# Patient Record
Sex: Female | Born: 1945
Health system: Southern US, Community
[De-identification: ages and names within clinical notes are randomized; demographics above are authoritative.]

## PROBLEM LIST (undated history)

## (undated) DIAGNOSIS — Z889 Allergy status to unspecified drugs, medicaments and biological substances status: Secondary | ICD-10-CM

## (undated) DIAGNOSIS — I10 Essential (primary) hypertension: Secondary | ICD-10-CM

## (undated) HISTORY — PX: GALLBLADDER SURGERY: SHX652

## (undated) HISTORY — PX: OTHER SURGICAL HISTORY: SHX169

## (undated) HISTORY — PX: PARTIAL HIP ARTHROPLASTY: SHX733

## (undated) HISTORY — PX: TOTAL ABDOMINAL HYSTERECTOMY: SHX209

## (undated) HISTORY — PX: APPENDECTOMY: SHX54

---

## 1998-04-17 ENCOUNTER — Other Ambulatory Visit: Admission: RE | Admit: 1998-04-17 | Discharge: 1998-04-17 | Payer: Self-pay | Admitting: *Deleted

## 2001-06-19 ENCOUNTER — Ambulatory Visit (HOSPITAL_COMMUNITY): Admission: RE | Admit: 2001-06-19 | Discharge: 2001-06-19 | Payer: Self-pay | Admitting: Family Medicine

## 2001-06-19 ENCOUNTER — Encounter: Payer: Self-pay | Admitting: Family Medicine

## 2004-01-23 ENCOUNTER — Inpatient Hospital Stay (HOSPITAL_COMMUNITY): Admission: EM | Admit: 2004-01-23 | Discharge: 2004-01-27 | Payer: Self-pay | Admitting: Emergency Medicine

## 2010-07-27 ENCOUNTER — Inpatient Hospital Stay (HOSPITAL_COMMUNITY)
Admission: RE | Admit: 2010-07-27 | Discharge: 2010-07-30 | Payer: Self-pay | Source: Home / Self Care | Admitting: Orthopedic Surgery

## 2010-08-31 ENCOUNTER — Inpatient Hospital Stay (HOSPITAL_COMMUNITY): Admission: RE | Admit: 2010-08-31 | Discharge: 2010-09-02 | Payer: Self-pay | Admitting: Orthopedic Surgery

## 2011-01-11 LAB — CBC
Hemoglobin: 7.7 g/dL — ABNORMAL LOW (ref 12.0–15.0)
MCHC: 34 g/dL (ref 30.0–36.0)
Platelets: 204 10*3/uL (ref 150–400)
Platelets: 231 10*3/uL (ref 150–400)
RBC: 3.08 MIL/uL — ABNORMAL LOW (ref 3.87–5.11)
RDW: 12.8 % (ref 11.5–15.5)
RDW: 13.3 % (ref 11.5–15.5)
WBC: 12.4 10*3/uL — ABNORMAL HIGH (ref 4.0–10.5)

## 2011-01-11 LAB — BASIC METABOLIC PANEL
Calcium: 8.3 mg/dL — ABNORMAL LOW (ref 8.4–10.5)
Calcium: 8.3 mg/dL — ABNORMAL LOW (ref 8.4–10.5)
Chloride: 104 mEq/L (ref 96–112)
Creatinine, Ser: 0.82 mg/dL (ref 0.4–1.2)
Creatinine, Ser: 0.83 mg/dL (ref 0.4–1.2)
GFR calc Af Amer: >60 mL/min (ref >60)
GFR calc non Af Amer: >60 mL/min (ref >60)
GFR calc non Af Amer: >60 mL/min (ref >60)
Glucose, Bld: 133 mg/dL — ABNORMAL HIGH (ref 70–99)
Sodium: 138 mEq/L (ref 135–145)

## 2011-01-11 LAB — TYPE AND SCREEN
Antibody Screen: NEGATIVE
Unit division: 0

## 2011-01-11 LAB — PREPARE RBC (CROSSMATCH)

## 2011-01-12 LAB — BASIC METABOLIC PANEL
Calcium: 9.6 mg/dL (ref 8.4–10.5)
Creatinine, Ser: 1.02 mg/dL (ref 0.4–1.2)
GFR calc Af Amer: >60 mL/min (ref >60)
GFR calc non Af Amer: 55 mL/min — ABNORMAL LOW (ref >60)
Sodium: 138 mEq/L (ref 135–145)

## 2011-01-12 LAB — URINALYSIS, ROUTINE W REFLEX MICROSCOPIC
Bilirubin Urine: NEGATIVE
Hgb urine dipstick: NEGATIVE
Nitrite: NEGATIVE
Protein, ur: NEGATIVE mg/dL
Specific Gravity, Urine: 1.018 (ref 1.005–1.030)
Urobilinogen, UA: 0.2 mg/dL (ref 0.0–1.0)

## 2011-01-12 LAB — URINE MICROSCOPIC-ADD ON

## 2011-01-12 LAB — DIFFERENTIAL
Basophils Relative: 1 % (ref 0–1)
Eosinophils Absolute: 0.2 10*3/uL (ref 0.0–0.7)
Lymphs Abs: 2.3 10*3/uL (ref 0.7–4.0)
Monocytes Relative: 6 % (ref 3–12)
Neutro Abs: 5.8 10*3/uL (ref 1.7–7.7)
Neutrophils Relative %: 65 % (ref 43–77)

## 2011-01-12 LAB — CBC
Hemoglobin: 12.4 g/dL (ref 12.0–15.0)
MCHC: 33.9 g/dL (ref 30.0–36.0)
Platelets: 334 10*3/uL (ref 150–400)
RBC: 3.87 MIL/uL (ref 3.87–5.11)

## 2011-01-12 LAB — SURGICAL PCR SCREEN: Staphylococcus aureus: NEGATIVE

## 2011-01-12 LAB — PROTIME-INR
INR: 0.96 (ref 0.00–1.49)
Prothrombin Time: 13 seconds (ref 11.6–15.2)

## 2011-01-12 LAB — APTT: aPTT: 34 seconds (ref 24–37)

## 2011-01-13 LAB — CBC
HCT: 20.4 % — ABNORMAL LOW (ref 36.0–46.0)
HCT: 23.6 % — ABNORMAL LOW (ref 36.0–46.0)
HCT: 35 % — ABNORMAL LOW (ref 36.0–46.0)
Hemoglobin: 10.1 g/dL — ABNORMAL LOW (ref 12.0–15.0)
Hemoglobin: 12 g/dL (ref 12.0–15.0)
MCH: 32.4 pg (ref 26.0–34.0)
MCH: 32.6 pg (ref 26.0–34.0)
MCHC: 34.3 g/dL (ref 30.0–36.0)
MCV: 93.2 fL (ref 78.0–100.0)
MCV: 94.7 fL (ref 78.0–100.0)
MCV: 95.1 fL (ref 78.0–100.0)
Platelets: 252 10*3/uL (ref 150–400)
RBC: 2.15 MIL/uL — ABNORMAL LOW (ref 3.87–5.11)
RBC: 2.47 MIL/uL — ABNORMAL LOW (ref 3.87–5.11)
RBC: 3.1 MIL/uL — ABNORMAL LOW (ref 3.87–5.11)
RDW: 12.3 % (ref 11.5–15.5)
RDW: 12.6 % (ref 11.5–15.5)
WBC: 10.8 10*3/uL — ABNORMAL HIGH (ref 4.0–10.5)
WBC: 8.6 10*3/uL (ref 4.0–10.5)

## 2011-01-13 LAB — ABO/RH: ABO/RH(D): A POS

## 2011-01-13 LAB — URINALYSIS, ROUTINE W REFLEX MICROSCOPIC
Bilirubin Urine: NEGATIVE
Glucose, UA: NEGATIVE mg/dL
Nitrite: POSITIVE — AB
Specific Gravity, Urine: 1.016 (ref 1.005–1.030)
pH: 6 (ref 5.0–8.0)

## 2011-01-13 LAB — BASIC METABOLIC PANEL
BUN: 24 mg/dL — ABNORMAL HIGH (ref 6–23)
CO2: 24 mEq/L (ref 19–32)
Chloride: 106 mEq/L (ref 96–112)
Chloride: 108 mEq/L (ref 96–112)
GFR calc Af Amer: >60 mL/min (ref >60)
GFR calc non Af Amer: 58 mL/min — ABNORMAL LOW (ref >60)
Glucose, Bld: 100 mg/dL — ABNORMAL HIGH (ref 70–99)
Glucose, Bld: 96 mg/dL (ref 70–99)
Potassium: 3.7 mEq/L (ref 3.5–5.1)
Potassium: 4.1 mEq/L (ref 3.5–5.1)
Potassium: 4.4 mEq/L (ref 3.5–5.1)
Sodium: 137 mEq/L (ref 135–145)
Sodium: 139 mEq/L (ref 135–145)

## 2011-01-13 LAB — DIFFERENTIAL
Basophils Absolute: 0 10*3/uL (ref 0.0–0.1)
Basophils Relative: 0 % (ref 0–1)
Eosinophils Absolute: 0.1 10*3/uL (ref 0.0–0.7)
Eosinophils Relative: 1 % (ref 0–5)
Monocytes Absolute: 0.5 10*3/uL (ref 0.1–1.0)
Monocytes Relative: 7 % (ref 3–12)
Neutro Abs: 5.8 10*3/uL (ref 1.7–7.7)

## 2011-01-13 LAB — TYPE AND SCREEN: ABO/RH(D): A POS

## 2011-01-13 LAB — URINE MICROSCOPIC-ADD ON

## 2011-01-13 LAB — SURGICAL PCR SCREEN: Staphylococcus aureus: NEGATIVE

## 2011-01-13 LAB — APTT: aPTT: 30 seconds (ref 24–37)

## 2011-03-18 NOTE — Procedures (Signed)
NAME:  Tanya Hammond, Tanya Hammond                           ACCOUNT NO.:  [id number]   MEDICAL RECORD NO.:  [id number]                   PATIENT TYPE:  INP   LOCATION:  A328                                 FACILITY:  [facility]   PHYSICIAN:  Dani Gobble, MD                    DATE OF BIRTH:  10/13/1946   DATE OF PROCEDURE:  DATE OF DISCHARGE:                                  ECHOCARDIOGRAM   INDICATION:  [name] is a 65 year old female without prior cardiac history  who has had a stroke.   TECHNICAL QUALITY:  Severely limited secondary to patient body habitus and  poor acoustic windows.   FINDINGS:  1. The aorta appears subjectively to be within normal limits in size.  2. The same could be said for the left atrium.  3. The patient appeared to be in sinus rhythm with a rate in the 90's     throughout the procedure.  The study is not adequate to assess for subtle     findings such as mass or clot.  4. The intraventricular septum and posterior wall are mildly thickened.  5. The aortic valve is not well visualized but appears to have reasonable     leaflet excursion.  No obvious aortic insufficiency is appreciated.     Doppler studies reveal a peak velocity of 1.8 meters per second     corresponding to a peak gradient of 13 mmHg and a mean gradient of 9     mmHg.  6. The mitral valve also is not well visualized, but appeared to be grossly     structurally normal.  Mild mitral annular calcification was noted.  Mild     mitral regurgitation was noted.  No mitral valve prolapse was noted.     Doppler interrogation of the mitral valve is within normal limits.  7. The pulmonic valve was not visualized.  8. The right-sided structures were also poorly visualized.  9. The left ventricle appeared grossly normal in size with vigorous     hypodynamic left ventricular systolic function.  There was virtual     systolic cavity obliteration.  The presence of diastolic dysfunction is     inferred from pulse wave  Doppler across the mitral valve.   IMPRESSION:  1. Technically difficult study secondary to patient body habitus and poor     acoustic windows.  2. Mild concentric left ventricular hypertrophy.  3. Mild aortic sclerosis without stenosis by Doppler only, as the valve was     not well visualized.  4. Mild mitral annular calcification.  5. Mild mitral regurgitation.  6. Vigorous to hyperdynamic left ventricular systolic function with virtual     systolic cavity obliteration; unable to     assess for regional wall motion abnormalities on this study.  7. Right sided structures were not well visualized.  8. Consider transesophageal echocardiogram if clinically indicated.  ___________________________________________                                            Dani Gobble, MD   AB/MEDQ  D:  01/26/2004  T:  01/26/2004  Job:  [id number]   cc:   Kirk Ruths, M.D.  [address]  [place]  [place] [address]  Fax: [phone]

## 2011-03-18 NOTE — Consult Note (Signed)
NAME:  Tanya Hammond, Tanya Hammond                           ACCOUNT NO.:  [id number]   MEDICAL RECORD NO.:  [id number]                   PATIENT TYPE:  INP   LOCATION:  A223                                 FACILITY:  [facility]   PHYSICIAN:  Kofi A. Gerilyn Pilgrim, M.D.              DATE OF BIRTH:  04-14-1946   DATE OF CONSULTATION:  01/23/2004  DATE OF DISCHARGE:                                   CONSULTATION   REASON FOR CONSULTATION:  Acute stroke.   IMPRESSION:  Acute stroke, lacunar type, with pure motor hemiparesis likely  located in internal capsule on the right but also could be in the pontine  region or corona radiata.   RECOMMENDATIONS:  She already has carotids and Dopplers ordered, agree with  this. I also agree with fasting lipid profile.  I went to add homocystine  level. I would recommend that she be treated with lipid lowering agents with  ____________ regardless of her cholesterol levels as this can further reduce  her risk.  I want to discontinue her heparin and place her on Plavix  antiplatelet agents.  I will probably start her on low dose Lovenox for DVT  prophylaxis.   HISTORY:  This is a 65 year old, right handed, Caucasian female who  developed acute onset of difficulty using her left side while she was  driving on yesterday. She attempted to get out of the car and noticed that  she had difficulty using her left upper extremity and left leg. She in fact  fell three times. The patient denied any numbness but the chart revealed  that she may have had some numbness in the right face.  The patient had a  longstanding history of hypertension for about 30 years.  The patient was  not taking any form of antiplatelet agents before coming to the hospital.   PAST MEDICAL HISTORY:  She had a history of cholecystectomy, hysterectomy,  hypertension.   ADMISSION MEDICATIONS:  Atacand, Lorcet and Aleve.   REVIEW OF SYMPTOMS:  Apparently has had problems with chronic low back pain  with some  right leg weakness although she reports the weakness may have  gotten worse since she has been hospitalized. She also is complaining of a  lot of cramps in the right leg and she currently takes Lorcet and Aleve for  chronic back pain and leg problems. She probably has had bronchitis in the  past.  No headaches are reported.   ALLERGIES:  ASPIRIN causes significant GI upset.   PHYSICAL EXAMINATION:  GENERAL:  Well built lady in no acute distress,  average weight.  VITAL SIGNS:  Blood pressure is 121/62, pulse 119, respirations 20,  temperature 98.0.  LUNGS:  Clear to auscultation bilaterally.  CARDIOVASCULAR:  Normal S1, S2.  ABDOMEN:  Soft.  EXTREMITIES:  Show some varicosities, no edema is noted.  NEUROLOGIC:  She is awake.  She is alert, she converses fluently,  coherently.  No dysarthria is noted.  Naming and language is unremarkable.  Comprehension of fluids are normal.  Cranial nerves II-XII are intact  including the visual fields.  No facial asymmetry is noted. Motor  examination shows weakness of the left side with triceps and deltoids at 4.  On the left hand grip is normal.  She has about 4/5 weakness in the left leg  proximally, distally 5/5.  She also had some weakness of the right leg  proximally 4/5, distally 5/5. The right upper extremity is normal.  Reflexes  are brisk on the left side, toes upgoing on the left and downgoing on the  right.  Sensory examination showed normal to temperature, light touch and  double simultaneous stimulation.  Coordination is unremarkable.  Head CT  scan shows atrophy and chronic ischemia changes involving the white matter.   WBC 11, CPK 302, MB fraction 4.7.      ___________________________________________                                            Darleen Crocker A. Gerilyn Pilgrim, M.D.   KAD/MEDQ  D:  01/23/2004  T:  01/24/2004  Job:  [id number]

## 2011-03-18 NOTE — Discharge Summary (Signed)
NAME:  Tanya Hammond, Tanya Hammond                           ACCOUNT NO.:  [id number]   MEDICAL RECORD NO.:  [id number]                   PATIENT TYPE:  INP   LOCATION:  A328                                 FACILITY:  [facility]   PHYSICIAN:  Kirk Ruths, M.D.            DATE OF BIRTH:  [birth year]   DATE OF ADMISSION:  01/23/2004  DATE OF DISCHARGE:  01/27/2004                                 DISCHARGE SUMMARY   DISCHARGE DIAGNOSES:  1. Acute cerebrovascular accident with left hemiparesis, resolved.  2. Hypertension.  3. History of aspirin intolerance.  4. Status post gallbladder surgery and hysterectomy.  5. Chronic back pain.   HOSPITAL COURSE:  This 65 year old white female noted as she was driving  home from work day before admission, she had an inability to keep it in the  correct lane.  When she arrived at her home and went to get out of the car,  she fell, bruising herself.  The patient developed some numbness in her  face, continued to have falling during the next 12 hours.  The patient  presented to the emergency room with her mother the following day and had  multiple bruises of arms and chest and face and legs.  The patient had a  left facial droop at the time of admission with left upper and lower  weakness.  The patient was admitted to the floor.  CT scan showed atrophy  and small vessel ischemic changes, no acute abnormality.  Chest x-ray was  negative.  Laboratories were all basically within normal range, hemoglobin  being 12.8.  Electrolytes were normal with a BUN and creatinine of 21 and  0.6.  The patient had a homocysteine level slightly elevated at 15.89, with  the upper limits of normal being 13.9.  Initial CPK was elevated at 302 with  an MB of 4.7; this is consistent with her bruising.  Fasting lipid profile  showed a cholesterol of 178, triglycerides of 136, HDL of 51, LDL of 100.  The patient was seen in consultation by Dr. Darleen Crocker A. [place] for neurology,  who basically  changed the patient from heparin to Lovenox and Plavix.  The  patient received physical therapy and within 48 hours, had full resolution  of all of her symptoms with no weakness, no facial droop, no abnormality in  her gait.  Carotid Dopplers were negative for any kind of flow obstruction.  Echocardiogram showed no evidence of abnormality related to clot.  She is  100% recovered.   DISCHARGE MEDICATIONS:  She is discharged home on her Atacand, which she was  on previously, as well as Plavix 75 mg daily.   FOLLOWUP:  The patient will be followed in the office in 2-4 weeks as  needed.   CONDITION ON DISCHARGE:  She is stable at the time of discharge.     ___________________________________________  Kirk Ruths, M.D.   WMM/MEDQ  D:  01/27/2004  T:  01/27/2004  Job:  [id number]

## 2011-03-18 NOTE — H&P (Signed)
NAME:  Tanya Hammond, Tanya Hammond                           ACCOUNT NO.:  [id number]   MEDICAL RECORD NO.:  [id number]                   PATIENT TYPE:  EMS   LOCATION:  ED                                   FACILITY:  [facility]   PHYSICIAN:  Kirk Ruths, M.D.            DATE OF BIRTH:  12/01/1945   DATE OF ADMISSION:  01/23/2004  DATE OF DISCHARGE:                                HISTORY & PHYSICAL   CHIEF COMPLAINT:  Numbness and can not walk.   HISTORY OF PRESENT ILLNESS:  This is a 65 year old white female with a  history of hypertension who stated she noticed yesterday driving home from  work, she had difficulty staying on her side of the road.  When she arrived  at home, she got out of her car, and she fell down and had difficulty  ambulating.  The patient also has noticed some numbness on the right side of  her face, a feeling of drawing.  The patient states she has continued to  fall during the night, and with difficulty arising.  She presents to the  emergency room with multiple bruises to her chest and arms.  She has an  obvious left facial droop, and a presumptive diagnosis of a CVA.   She will be admitted for IV heparin, physical therapy as well as evaluation  of her carotids and cardiac.   PAST MEDICAL HISTORY:  She is allergic to aspirin.   CURRENT MEDICATIONS:  1. She currently takes Atacand 16 mg daily for hypertension.  2. Lorcet Plus.  3. Aleve for chronic back pain.   PAST SURGICAL HISTORY:  She is status post cholecystectomy and hysterectomy.   REVIEW OF SYSTEMS:  She denies chest pain.  She denies headache.  Chronic  bronchitis over the last several weeks.   PHYSICAL EXAMINATION:  GENERAL:  A well-developed, well-nourished white  female, in no severe distress.  VITAL SIGNS:  Blood pressure 146/88, respirations 20 and unlabored.  She is  afebrile.  Pulse is 88 and regular.  HEENT:  TM's are normal.  Pupils are equal to light and accommodation.  Oropharynx is benign.  NECK:  Supple without JVD, bruits or thyromegaly.  LUNGS:  Some occasional rhonchi throughout.  HEART:  Regular sinus rhythm, no murmur, gallop or rub.  ABDOMEN:  Soft and nontender.  EXTREMITIES:  Without clubbing, cyanosis or edema.  NEUROLOGIC:  There is an obvious left facial droop.  Her strength is 2/5 on  the left upper extremity and mild decreased strength in the left lower  extremity.  Reflexes are bilaterally equal.   ASSESSMENT:  1. Cerebrovascular accident involving the right middle cerebral artery with     left-sided hemiparesis.  2. History of hypertension.  3. History of aspirin allergy.  4. History of status post gallbladder surgery and hysterectomy.     ___________________________________________  Kirk Ruths, M.D.   WMM/MEDQ  D:  01/23/2004  T:  01/23/2004  Job:  [id number]

## 2011-07-07 ENCOUNTER — Emergency Department (HOSPITAL_COMMUNITY)
Admission: EM | Admit: 2011-07-07 | Discharge: 2011-07-07 | Disposition: A | Discharge status: Home / Self Care | Payer: Medicare Other | Attending: Emergency Medicine | Admitting: Emergency Medicine

## 2011-07-07 ENCOUNTER — Encounter: Payer: Self-pay | Admitting: *Deleted

## 2011-07-07 DIAGNOSIS — F17200 Nicotine dependence, unspecified, uncomplicated: Secondary | ICD-10-CM | POA: Insufficient documentation

## 2011-07-07 DIAGNOSIS — H1130 Conjunctival hemorrhage, unspecified eye: Secondary | ICD-10-CM | POA: Insufficient documentation

## 2011-07-07 DIAGNOSIS — I10 Essential (primary) hypertension: Secondary | ICD-10-CM | POA: Insufficient documentation

## 2011-07-07 HISTORY — DX: Allergy status to unspecified drugs, medicaments and biological substances: Z88.9

## 2011-07-07 HISTORY — DX: Essential (primary) hypertension: I10

## 2011-07-07 NOTE — ED Notes (Signed)
Pt a/ox4. Resp even and unlabored. NAD at this time. D/C instructions reviewed with pt. Pt verbalized understanding. Pt ambulated to POV with steady gate. 

## 2011-07-07 NOTE — ED Provider Notes (Signed)
History     CSN: [id number] Arrival date & time: 07/07/2011  7:32 PM  Chief Complaint  Patient presents with  . Eye Problem   Patient is a 65 y.o. female presenting with eye problem. The history is provided by the patient.  Eye Problem  This is a new problem. The current episode started 3 to 5 hours ago. The problem occurs constantly. The problem has not changed since onset.There is pain in the left eye. There was no injury mechanism. The pain is at a severity of 0/10. The patient is experiencing no pain. Associated symptoms include eye redness. Pertinent negatives include no blurred vision, no discharge, no foreign body sensation, no photophobia and no itching. She has tried nothing for the symptoms.  She noticed her eye was red after coming in from her yard.  Past Medical History  Diagnosis Date  . Hypertension   . Multiple allergies     Past Surgical History  Procedure Date  . Ortho procedures     History reviewed. No pertinent family history.  History  Substance Use Topics  . Smoking status: Current Everyday Smoker  . Smokeless tobacco: Not on file  . Alcohol Use: Yes    OB History    Grav Para Term Preterm Abortions TAB SAB Ect Mult Living                  Review of Systems  Eyes: Positive for redness. Negative for blurred vision, photophobia and discharge.  Skin: Negative for itching.  All other systems reviewed and are negative.    Physical Exam  BP 121/71  Pulse 104  Temp(Src) 98.5 F (36.9 C) (Oral)  Resp 18  SpO2 98%  Physical Exam  Nursing note and vitals reviewed. Constitutional: She is oriented to person, place, and time. She appears well-developed and well-nourished. No distress.  HENT:  Head: Normocephalic and atraumatic.  Right Ear: External ear normal.  Left Ear: External ear normal.  Mouth/Throat: Oropharynx is clear and moist.  Eyes: EOM are normal. Pupils are equal, round, and reactive to light. Left conjunctiva has a hemorrhage. No  scleral icterus.  Fundoscopic exam:      The right eye shows no exudate, no hemorrhage and no papilledema.       The left eye shows no exudate, no hemorrhage and no papilledema.  Neck: Normal range of motion. Neck supple. No JVD present.  Cardiovascular: Normal rate, regular rhythm and normal heart sounds.   No murmur heard. Pulmonary/Chest: Effort normal and breath sounds normal. She has no wheezes. She has no rales. She exhibits no tenderness.  Abdominal: Bowel sounds are normal. She exhibits no mass. There is no tenderness.  Musculoskeletal: Normal range of motion. She exhibits no edema and no tenderness.  Lymphadenopathy:    She has no cervical adenopathy.  Neurological: She is alert and oriented to person, place, and time. She has normal reflexes. No cranial nerve deficit. Coordination normal.  Skin: Skin is warm and dry. No rash noted.  Psychiatric: She has a normal mood and affect. Her behavior is normal. Judgment and thought content normal.    ED Course  Procedures  MDM Subconjunctival hemorrhage, no evidence of other eye injury.      Dione Booze, MD 07/07/11 2018

## 2011-07-07 NOTE — ED Notes (Signed)
States she came in from working outside and noted redness to left sclera left eye

## 2012-03-01 DIAGNOSIS — I10 Essential (primary) hypertension: Secondary | ICD-10-CM | POA: Diagnosis not present

## 2012-03-01 DIAGNOSIS — F17200 Nicotine dependence, unspecified, uncomplicated: Secondary | ICD-10-CM | POA: Diagnosis not present

## 2012-04-03 DIAGNOSIS — Z1231 Encounter for screening mammogram for malignant neoplasm of breast: Secondary | ICD-10-CM | POA: Diagnosis not present

## 2012-06-07 DIAGNOSIS — R300 Dysuria: Secondary | ICD-10-CM | POA: Diagnosis not present

## 2012-06-07 DIAGNOSIS — Z0000 Encounter for general adult medical examination without abnormal findings: Secondary | ICD-10-CM | POA: Diagnosis not present

## 2012-06-07 DIAGNOSIS — F17200 Nicotine dependence, unspecified, uncomplicated: Secondary | ICD-10-CM | POA: Diagnosis not present

## 2012-06-07 DIAGNOSIS — I10 Essential (primary) hypertension: Secondary | ICD-10-CM | POA: Diagnosis not present

## 2012-12-06 DIAGNOSIS — F17200 Nicotine dependence, unspecified, uncomplicated: Secondary | ICD-10-CM | POA: Diagnosis not present

## 2012-12-06 DIAGNOSIS — E785 Hyperlipidemia, unspecified: Secondary | ICD-10-CM | POA: Diagnosis not present

## 2012-12-06 DIAGNOSIS — I10 Essential (primary) hypertension: Secondary | ICD-10-CM | POA: Diagnosis not present

## 2013-04-25 DIAGNOSIS — J019 Acute sinusitis, unspecified: Secondary | ICD-10-CM | POA: Diagnosis not present

## 2013-06-06 DIAGNOSIS — E785 Hyperlipidemia, unspecified: Secondary | ICD-10-CM | POA: Diagnosis not present

## 2013-06-06 DIAGNOSIS — I10 Essential (primary) hypertension: Secondary | ICD-10-CM | POA: Diagnosis not present

## 2013-06-06 DIAGNOSIS — F17200 Nicotine dependence, unspecified, uncomplicated: Secondary | ICD-10-CM | POA: Diagnosis not present

## 2013-07-03 DIAGNOSIS — Z96649 Presence of unspecified artificial hip joint: Secondary | ICD-10-CM | POA: Diagnosis not present

## 2013-12-09 DIAGNOSIS — I10 Essential (primary) hypertension: Secondary | ICD-10-CM | POA: Diagnosis not present

## 2013-12-09 DIAGNOSIS — R7301 Impaired fasting glucose: Secondary | ICD-10-CM | POA: Diagnosis not present

## 2013-12-12 DIAGNOSIS — I10 Essential (primary) hypertension: Secondary | ICD-10-CM | POA: Diagnosis not present

## 2013-12-12 DIAGNOSIS — E785 Hyperlipidemia, unspecified: Secondary | ICD-10-CM | POA: Diagnosis not present

## 2013-12-12 DIAGNOSIS — R7301 Impaired fasting glucose: Secondary | ICD-10-CM | POA: Diagnosis not present

## 2014-03-11 DIAGNOSIS — F3289 Other specified depressive episodes: Secondary | ICD-10-CM | POA: Diagnosis not present

## 2014-03-11 DIAGNOSIS — I10 Essential (primary) hypertension: Secondary | ICD-10-CM | POA: Diagnosis not present

## 2014-03-11 DIAGNOSIS — F329 Major depressive disorder, single episode, unspecified: Secondary | ICD-10-CM | POA: Diagnosis not present

## 2014-05-01 DIAGNOSIS — I10 Essential (primary) hypertension: Secondary | ICD-10-CM | POA: Diagnosis not present

## 2014-05-01 DIAGNOSIS — F3289 Other specified depressive episodes: Secondary | ICD-10-CM | POA: Diagnosis not present

## 2014-05-01 DIAGNOSIS — J019 Acute sinusitis, unspecified: Secondary | ICD-10-CM | POA: Diagnosis not present

## 2014-05-01 DIAGNOSIS — F329 Major depressive disorder, single episode, unspecified: Secondary | ICD-10-CM | POA: Diagnosis not present

## 2015-10-08 DIAGNOSIS — I10 Essential (primary) hypertension: Secondary | ICD-10-CM | POA: Diagnosis not present

## 2015-10-08 DIAGNOSIS — R7301 Impaired fasting glucose: Secondary | ICD-10-CM | POA: Diagnosis not present

## 2015-10-12 DIAGNOSIS — I10 Essential (primary) hypertension: Secondary | ICD-10-CM | POA: Diagnosis not present

## 2015-10-12 DIAGNOSIS — R7301 Impaired fasting glucose: Secondary | ICD-10-CM | POA: Diagnosis not present

## 2015-10-12 DIAGNOSIS — E870 Hyperosmolality and hypernatremia: Secondary | ICD-10-CM | POA: Diagnosis not present

## 2015-10-12 DIAGNOSIS — F339 Major depressive disorder, recurrent, unspecified: Secondary | ICD-10-CM | POA: Diagnosis not present

## 2016-02-18 DIAGNOSIS — K047 Periapical abscess without sinus: Secondary | ICD-10-CM | POA: Diagnosis not present

## 2016-04-12 DIAGNOSIS — R7301 Impaired fasting glucose: Secondary | ICD-10-CM | POA: Diagnosis not present

## 2016-04-12 DIAGNOSIS — E782 Mixed hyperlipidemia: Secondary | ICD-10-CM | POA: Diagnosis not present

## 2016-04-14 DIAGNOSIS — I10 Essential (primary) hypertension: Secondary | ICD-10-CM | POA: Diagnosis not present

## 2016-04-14 DIAGNOSIS — E870 Hyperosmolality and hypernatremia: Secondary | ICD-10-CM | POA: Diagnosis not present

## 2016-04-14 DIAGNOSIS — R7301 Impaired fasting glucose: Secondary | ICD-10-CM | POA: Diagnosis not present

## 2016-07-06 DIAGNOSIS — H5212 Myopia, left eye: Secondary | ICD-10-CM | POA: Diagnosis not present

## 2016-07-06 DIAGNOSIS — H5201 Hypermetropia, right eye: Secondary | ICD-10-CM | POA: Diagnosis not present

## 2016-07-06 DIAGNOSIS — H52223 Regular astigmatism, bilateral: Secondary | ICD-10-CM | POA: Diagnosis not present

## 2016-07-06 DIAGNOSIS — Q132 Other congenital malformations of iris: Secondary | ICD-10-CM | POA: Diagnosis not present

## 2016-10-04 ENCOUNTER — Other Ambulatory Visit (HOSPITAL_COMMUNITY): Payer: Self-pay | Admitting: Internal Medicine

## 2016-10-04 DIAGNOSIS — Z1231 Encounter for screening mammogram for malignant neoplasm of breast: Secondary | ICD-10-CM

## 2016-10-11 DIAGNOSIS — R7301 Impaired fasting glucose: Secondary | ICD-10-CM | POA: Diagnosis not present

## 2016-10-11 DIAGNOSIS — E782 Mixed hyperlipidemia: Secondary | ICD-10-CM | POA: Diagnosis not present

## 2016-10-11 DIAGNOSIS — Z1159 Encounter for screening for other viral diseases: Secondary | ICD-10-CM | POA: Diagnosis not present

## 2016-10-13 ENCOUNTER — Encounter (HOSPITAL_COMMUNITY): Payer: Self-pay | Admitting: Radiology

## 2016-10-13 ENCOUNTER — Ambulatory Visit (HOSPITAL_COMMUNITY)
Admission: RE | Admit: 2016-10-13 | Discharge: 2016-10-13 | Disposition: A | Discharge status: Home / Self Care | Payer: Medicare Other | Source: Ambulatory Visit | Attending: Internal Medicine | Admitting: Internal Medicine

## 2016-10-13 DIAGNOSIS — Z1231 Encounter for screening mammogram for malignant neoplasm of breast: Secondary | ICD-10-CM | POA: Diagnosis not present

## 2016-10-13 DIAGNOSIS — I10 Essential (primary) hypertension: Secondary | ICD-10-CM | POA: Diagnosis not present

## 2016-10-13 DIAGNOSIS — R011 Cardiac murmur, unspecified: Secondary | ICD-10-CM | POA: Diagnosis not present

## 2016-10-13 DIAGNOSIS — E870 Hyperosmolality and hypernatremia: Secondary | ICD-10-CM | POA: Diagnosis not present

## 2016-10-17 ENCOUNTER — Other Ambulatory Visit: Payer: Self-pay | Admitting: Internal Medicine

## 2016-10-17 ENCOUNTER — Ambulatory Visit (HOSPITAL_COMMUNITY): Payer: Medicare Other

## 2016-10-17 DIAGNOSIS — R928 Other abnormal and inconclusive findings on diagnostic imaging of breast: Secondary | ICD-10-CM

## 2016-10-25 ENCOUNTER — Ambulatory Visit (HOSPITAL_COMMUNITY)
Admission: RE | Admit: 2016-10-25 | Discharge: 2016-10-25 | Disposition: A | Discharge status: Home / Self Care | Payer: Medicare Other | Source: Ambulatory Visit | Attending: Internal Medicine | Admitting: Internal Medicine

## 2016-10-25 DIAGNOSIS — R928 Other abnormal and inconclusive findings on diagnostic imaging of breast: Secondary | ICD-10-CM | POA: Diagnosis not present

## 2016-10-25 DIAGNOSIS — N6320 Unspecified lump in the left breast, unspecified quadrant: Secondary | ICD-10-CM | POA: Diagnosis not present

## 2016-11-10 DIAGNOSIS — I10 Essential (primary) hypertension: Secondary | ICD-10-CM | POA: Diagnosis not present

## 2016-12-05 ENCOUNTER — Ambulatory Visit: Payer: Medicare Other | Admitting: Cardiovascular Disease

## 2016-12-05 DIAGNOSIS — S39012A Strain of muscle, fascia and tendon of lower back, initial encounter: Secondary | ICD-10-CM | POA: Diagnosis not present

## 2016-12-05 DIAGNOSIS — M545 Low back pain: Secondary | ICD-10-CM | POA: Diagnosis not present

## 2016-12-27 ENCOUNTER — Ambulatory Visit: Payer: Medicare Other | Admitting: Cardiology

## 2017-01-20 ENCOUNTER — Other Ambulatory Visit: Payer: Self-pay | Admitting: Cardiovascular Disease

## 2017-01-20 ENCOUNTER — Ambulatory Visit (INDEPENDENT_AMBULATORY_CARE_PROVIDER_SITE_OTHER): Payer: Medicare Other | Admitting: Cardiovascular Disease

## 2017-01-20 ENCOUNTER — Encounter: Payer: Self-pay | Admitting: Cardiovascular Disease

## 2017-01-20 VITALS — BP 116/70 | HR 93 | Ht 68.0 in | Wt 184.0 lb

## 2017-01-20 DIAGNOSIS — I10 Essential (primary) hypertension: Secondary | ICD-10-CM

## 2017-01-20 DIAGNOSIS — R0789 Other chest pain: Secondary | ICD-10-CM

## 2017-01-20 DIAGNOSIS — I209 Angina pectoris, unspecified: Secondary | ICD-10-CM

## 2017-01-20 DIAGNOSIS — R011 Cardiac murmur, unspecified: Secondary | ICD-10-CM | POA: Diagnosis not present

## 2017-01-20 MED ORDER — NITROGLYCERIN 0.4 MG SL SUBL: 0.4000 mg | SUBLINGUAL_TABLET | SUBLINGUAL | 3 refills | Status: DC | PRN

## 2017-01-20 NOTE — Patient Instructions (Addendum)
Your physician recommends that you schedule a follow-up appointment in: 6 weeks Dr Purvis SheffieldKoneswaran   Your physician has requested that you have an echocardiogram. Echocardiography is a painless test that uses sound waves to create images of your heart. It provides your doctor with information about the size and shape of your heart and how well your heart's chambers and valves are working. This procedure takes approximately one hour. There are no restrictions for this procedure.     Your physician has requested that you have en exercise stress myoview. For further information please visit [email or web address]. Please follow instruction sheet, as given.    Take nitroglycerine as directed   Nitroglycerin sublingual tablets What is this medicine? NITROGLYCERIN (nye troe GLI ser in) is a type of vasodilator. It relaxes blood vessels, increasing the blood and oxygen supply to your heart. This medicine is used to relieve chest pain caused by angina. It is also used to prevent chest pain before activities like climbing stairs, going outdoors in cold weather, or sexual activity. This medicine may be used for other purposes; ask your health care provider or pharmacist if you have questions. COMMON BRAND NAME(S): Nitroquick, Nitrostat, Nitrotab What should I tell my health care provider before I take this medicine? They need to know if you have any of these conditions: -anemia -head injury, recent stroke, or bleeding in the brain -liver disease -previous heart attack -an unusual or allergic reaction to nitroglycerin, other medicines, foods, dyes, or preservatives -pregnant or trying to get pregnant -breast-feeding How should I use this medicine? Take this medicine by mouth as needed. At the first sign of an angina attack (chest pain or tightness) place one tablet under your tongue. You can also take this medicine 5 to 10 minutes before an event likely to produce chest pain. Follow the directions on the  prescription label. Let the tablet dissolve under the tongue. Do not swallow whole. Replace the dose if you accidentally swallow it. It will help if your mouth is not dry. Saliva around the tablet will help it to dissolve more quickly. Do not eat or drink, smoke or chew tobacco while a tablet is dissolving. If you are not better within 5 minutes after taking ONE dose of nitroglycerin, call 9-1-1 immediately to seek emergency medical care. Do not take more than 3 nitroglycerin tablets over 15 minutes. If you take this medicine often to relieve symptoms of angina, your doctor or health care professional may provide you with different instructions to manage your symptoms. If symptoms do not go away after following these instructions, it is important to call 9-1-1 immediately. Do not take more than 3 nitroglycerin tablets over 15 minutes. Talk to your pediatrician regarding the use of this medicine in children. Special care may be needed. Overdosage: If you think you have taken too much of this medicine contact a poison control center or emergency room at once. NOTE: This medicine is only for you. Do not share this medicine with others. What if I miss a dose? This does not apply. This medicine is only used as needed. What may interact with this medicine? Do not take this medicine with any of the following medications: -certain migraine medicines like ergotamine and dihydroergotamine (DHE) -medicines used to treat erectile dysfunction like sildenafil, tadalafil, and vardenafil -riociguat This medicine may also interact with the following medications: -alteplase -aspirin -heparin -medicines for high blood pressure -medicines for mental depression -other medicines used to treat angina -phenothiazines like chlorpromazine, mesoridazine, prochlorperazine, thioridazine This list  may not describe all possible interactions. Give your health care provider a list of all the medicines, herbs, non-prescription  drugs, or dietary supplements you use. Also tell them if you smoke, drink alcohol, or use illegal drugs. Some items may interact with your medicine. What should I watch for while using this medicine? Tell your doctor or health care professional if you feel your medicine is no longer working. Keep this medicine with you at all times. Sit or lie down when you take your medicine to prevent falling if you feel dizzy or faint after using it. Try to remain calm. This will help you to feel better faster. If you feel dizzy, take several deep breaths and lie down with your feet propped up, or bend forward with your head resting between your knees. You may get drowsy or dizzy. Do not drive, use machinery, or do anything that needs mental alertness until you know how this drug affects you. Do not stand or sit up quickly, especially if you are an older patient. This reduces the risk of dizzy or fainting spells. Alcohol can make you more drowsy and dizzy. Avoid alcoholic drinks. Do not treat yourself for coughs, colds, or pain while you are taking this medicine without asking your doctor or health care professional for advice. Some ingredients may increase your blood pressure. What side effects may I notice from receiving this medicine? Side effects that you should report to your doctor or health care professional as soon as possible: -blurred vision -dry mouth -skin rash -sweating -the feeling of extreme pressure in the head -unusually weak or tired Side effects that usually do not require medical attention (report to your doctor or health care professional if they continue or are bothersome): -flushing of the face or neck -headache -irregular heartbeat, palpitations -nausea, vomiting This list may not describe all possible side effects. Call your doctor for medical advice about side effects. You may report side effects to FDA at 1-800-FDA-1088. Where should I keep my medicine? Keep out of the reach of  children. Store at room temperature between 20 and 25 degrees C (68 and 77 degrees F). Store in Retail buyer. Protect from light and moisture. Keep tightly closed. Throw away any unused medicine after the expiration date. NOTE: This sheet is a summary. It may not cover all possible information. If you have questions about this medicine, talk to your doctor, pharmacist, or health care provider.  2018 Elsevier/Gold Standard (2013-08-15 17:57:36)    Thank you for choosing [facility] !

## 2017-01-20 NOTE — Progress Notes (Signed)
CARDIOLOGY CONSULT NOTE  Patient ID: Tanya Hammond MRN: [id number] DOB/AGE: 69/01/1946 69 y.o.  Admit date: (Not on file) Primary Physician: Dwana MelenaZack Hall, MD Referring Physician:   Reason for Consultation: chest pain, murmur  HPI: The patient is a 71 year old woman with a history of hypertension and tobacco abuse referred for the evaluation of chest pain and a murmur.  For the past 6 months, she has been experiencing a heavy sensation across her chest which radiates into both upper parts of her chest and into both arms. It has progressively been getting worse over the past several months. It can occur both with exertion and while she is sitting down. Each episode may last 7 or 8 minutes. It is not associated with shortness of breath, lightheadedness, dizziness, nausea, or vomiting. She denies lightheadedness and syncope. She denies orthopnea and leg swelling. She has had palpitations and has recorded her heart rate at 100-130 bpm during these episodes of chest discomfort.  She had bilateral hip replacement but does have problems with sciatica from time to time. She smokes a half pack of cigarettes daily and has done so for 50 years.  She takes care of her 69 year old mother who has dementia and her maternal aunt who is [id number] and also has dementia. She is under a lot of stress.  ECG performed in the office today which I personally reviewed demonstrates normal sinus rhythm with no ischemic ST segment or T-wave abnormalities, nor any arrhythmias.     Allergies  Allergen Reactions  . Penicillins Hives    Current Outpatient Prescriptions  Medication Sig Dispense Refill  . amitriptyline (ELAVIL) 25 MG tablet Take 25 mg by mouth at bedtime.      . Calcium Carb-Cholecalciferol 500-400 MG-UNIT CHEW Chew 1 each by mouth daily.      Marland Kitchen. loratadine (CLARITIN) 10 MG tablet Take 10 mg by mouth daily.      . montelukast (SINGULAIR) 10 MG tablet Take 10 mg by mouth at bedtime.      .  valsartan-hydrochlorothiazide (DIOVAN HCT) 80-12.5 MG per tablet Take 1 tablet by mouth daily.      . vitamin B-12 (CYANOCOBALAMIN) 1000 MCG tablet Take 1,000 mcg by mouth daily.       No current facility-administered medications for this visit.     Past Medical History:  Diagnosis Date  . Hypertension   . Multiple allergies     Past Surgical History:  Procedure Laterality Date  . APPENDECTOMY    . GALLBLADDER SURGERY    . ortho procedures    . PARTIAL HIP ARTHROPLASTY     bi-lat  . TOTAL ABDOMINAL HYSTERECTOMY      Social History   Social History  . Marital status: Legally Separated    Spouse name: N/A  . Number of children: N/A  . Years of education: N/A   Occupational History  . Not on file.   Social History Main Topics  . Smoking status: Current Every Day Smoker    Packs/day: 0.05    Start date: 01/21/1967  . Smokeless tobacco: Never Used  . Alcohol use Yes  . Drug use: No  . Sexual activity: Not on file   Other Topics Concern  . Not on file   Social History Narrative  . No narrative on file     No family history of premature CAD in 1st degree relatives.  Prior to Admission medications   Medication Sig Start Date End Date Taking? Authorizing Provider  amitriptyline (ELAVIL) 25 MG tablet Take 25 mg by mouth at bedtime.      Historical Provider, MD  Calcium Carb-Cholecalciferol 500-400 MG-UNIT CHEW Chew 1 each by mouth daily.      Historical Provider, MD  loratadine (CLARITIN) 10 MG tablet Take 10 mg by mouth daily.      Historical Provider, MD  montelukast (SINGULAIR) 10 MG tablet Take 10 mg by mouth at bedtime.      Historical Provider, MD  valsartan-hydrochlorothiazide (DIOVAN HCT) 80-12.5 MG per tablet Take 1 tablet by mouth daily.      Historical Provider, MD  vitamin B-12 (CYANOCOBALAMIN) 1000 MCG tablet Take 1,000 mcg by mouth daily.      Historical Provider, MD     Review of systems complete and found to be negative unless listed above in  HPI     Physical exam Blood pressure 116/70, pulse 93, height 5\' 8"  (1.727 m), weight 184 lb (83.5 kg), SpO2 97 %. General: NAD Neck: No JVD, no thyromegaly or thyroid nodule.  Lungs: Clear to auscultation bilaterally with normal respiratory effort. CV: Nondisplaced PMI. Regular rate and rhythm, normal S1/S2, no S3/S4, 2/6 systolic murmur over RUSB.  No peripheral edema.  No carotid bruit.    Abdomen: Soft, nontender, no hepatosplenomegaly, no distention.  Skin: Intact without lesions or rashes.  Neurologic: Alert and oriented x 3.  Psych: Normal affect. Extremities: No clubbing or cyanosis.  HEENT: Normal.   ECG: Most recent ECG reviewed.  Telemetry: Independently reviewed.  Labs:   Lab Results  Component Value Date   WBC 12.4 (H) 09/02/2010   HGB 9.9 DELTA CHECK NOTED POST TRANSFUSION SPECIMEN (L) 09/02/2010   HCT 29.0 (L) 09/02/2010   MCV 94.3 09/02/2010   PLT 204 09/02/2010   No results for input(s): NA, K, CL, CO2, BUN, CREATININE, CALCIUM, PROT, BILITOT, ALKPHOS, ALT, AST, GLUCOSE in the last 168 hours.  Invalid input(s): LABALBU No results found for: CKTOTAL, CKMB, CKMBINDEX, TROPONINI No results found for: CHOL No results found for: HDL No results found for: LDLCALC No results found for: TRIG No results found for: CHOLHDL No results found for: LDLDIRECT       Studies: No results found.  ASSESSMENT AND PLAN:  1. Angina pectoris: She has both typical and atypical symptoms. Risk factors include tobacco abuse and hypertension. I will proceed with a nuclear myocardial perfusion imaging study to evaluate for ischemic heart disease (exercise Myoview). She would prefer to try walking on a treadmill but this may need to be converted to a Lexiscan if her sciatica is aggravated. I will also prescribe nitroglycerin. She is already taking ASA.  2. Cardiac murmur: Murmur is appreciated over the aortic area. She may have some aortic sclerosis or perhaps a mild degree of  aortic valve stenosis. I will obtain an echocardiogram to evaluate further.  3. Hypertension: Controlled. No changes.  Dispo: fu 6 weeks.   Signed: Prentice Docker, M.D., F.A.C.C.  01/20/2017, 10:40 AM

## 2017-01-31 ENCOUNTER — Ambulatory Visit (HOSPITAL_COMMUNITY)
Admission: RE | Admit: 2017-01-31 | Discharge: 2017-01-31 | Disposition: A | Discharge status: Home / Self Care | Payer: Medicare Other | Source: Ambulatory Visit | Attending: Cardiovascular Disease | Admitting: Cardiovascular Disease

## 2017-01-31 ENCOUNTER — Encounter (HOSPITAL_COMMUNITY)
Admission: RE | Admit: 2017-01-31 | Discharge: 2017-01-31 | Disposition: A | Discharge status: Home / Self Care | Payer: Medicare Other | Source: Ambulatory Visit | Attending: Cardiovascular Disease | Admitting: Cardiovascular Disease

## 2017-01-31 ENCOUNTER — Inpatient Hospital Stay (HOSPITAL_COMMUNITY): Admission: RE | Admit: 2017-01-31 | Payer: Medicare Other | Source: Ambulatory Visit

## 2017-01-31 ENCOUNTER — Encounter (HOSPITAL_COMMUNITY): Payer: Self-pay

## 2017-01-31 DIAGNOSIS — I209 Angina pectoris, unspecified: Secondary | ICD-10-CM

## 2017-01-31 DIAGNOSIS — Z720 Tobacco use: Secondary | ICD-10-CM | POA: Insufficient documentation

## 2017-01-31 DIAGNOSIS — I119 Hypertensive heart disease without heart failure: Secondary | ICD-10-CM | POA: Insufficient documentation

## 2017-01-31 DIAGNOSIS — I348 Other nonrheumatic mitral valve disorders: Secondary | ICD-10-CM | POA: Insufficient documentation

## 2017-01-31 DIAGNOSIS — R011 Cardiac murmur, unspecified: Secondary | ICD-10-CM | POA: Diagnosis not present

## 2017-01-31 LAB — NM MYOCAR MULTI W/SPECT W/WALL MOTION / EF
CHL CUP NUCLEAR SRS: 12
CHL CUP NUCLEAR SSS: 18
LHR: 0.27
LV dias vol: 57 mL (ref 46–106)
LV sys vol: 12 mL
NUC STRESS TID: 1.1
Peak HR: 112 {beats}/min
Rest HR: 77 {beats}/min
SDS: 6

## 2017-01-31 LAB — ECHOCARDIOGRAM COMPLETE
AOVTI: 40.1 cm
AV Area VTI: 2.55 cm2
AV Mean grad: 7 mmHg
AV Peak grad: 13 mmHg
AV VEL mean LVOT/AV: 0.93
AV pk vel: 177 cm/s
AV vel: 2.63
AVAREAMEANV: 2.63 cm2
AVAREAMEANVIN: 1.3 cm2/m2
AVAREAVTIIND: 1.3 cm2/m2
Ao pk vel: 0.9 m/s
CHL CUP AV PEAK INDEX: 1.26
CHL CUP DOP CALC LVOT VTI: 37.2 cm
CHL CUP MV DEC (S): 542
CHL CUP RV SYS PRESS: 22 mmHg
CHL CUP TV REG PEAK VELOCITY: 220 cm/s
DOP CAL AO MEAN VELOCITY: 123 cm/s
E/e' ratio: 9.71
EWDT: 542 ms
FS: 37 % (ref 28–44)
IVS/LV PW RATIO, ED: 0.95
LA ID, A-P, ES: 37 mm
LA diam index: 1.83 cm/m2
LA vol index: 20.7 mL/m2
LA vol: 41.9 mL
LAVOLA4C: 35.4 mL
LEFT ATRIUM END SYS DIAM: 37 mm
LV E/e' medial: 9.71
LV E/e'average: 9.71
LV PW d: 12.9 mm — AB (ref 0.6–1.1)
LV SIMPSON'S DISK: 70
LV TDI E'MEDIAL: 5.98
LV dias vol index: 28 mL/m2
LV sys vol index: 8 mL/m2
LVDIAVOL: 56 mL (ref 46–106)
LVELAT: 6.53 cm/s
LVOT SV: 106 mL
LVOT area: 2.84 cm2
LVOT diameter: 19 mm
LVOT peak grad rest: 10 mmHg
LVOTPV: 159 cm/s
LVOTVTI: 0.93 cm
LVSYSVOL: 17 mL (ref 14–42)
Lateral S' vel: 9.79 cm/s
MVPKAVEL: 108 m/s
MVPKEVEL: 63.4 m/s
Stroke v: 39 ml
TAPSE: 17.5 mm
TDI e' lateral: 6.53
TRMAXVEL: 220 cm/s
Valve area index: 1.3
Valve area: 2.63 cm2

## 2017-01-31 MED ORDER — REGADENOSON 0.4 MG/5ML IV SOLN
INTRAVENOUS | Status: AC
  Administered 2017-01-31: 0.4 mg via INTRAVENOUS
  Filled 2017-01-31: qty 5

## 2017-01-31 MED ORDER — SODIUM CHLORIDE 0.9% FLUSH
INTRAVENOUS | Status: AC
  Administered 2017-01-31: 10 mL via INTRAVENOUS
  Filled 2017-01-31: qty 10

## 2017-01-31 MED ORDER — TECHNETIUM TC 99M TETROFOSMIN IV KIT
10.0000 | PACK | Freq: Once | INTRAVENOUS | Status: AC | PRN
  Administered 2017-01-31: 10 via INTRAVENOUS

## 2017-01-31 MED ORDER — TECHNETIUM TC 99M TETROFOSMIN IV KIT
30.0000 | PACK | Freq: Once | INTRAVENOUS | Status: AC | PRN
  Administered 2017-01-31: 30 via INTRAVENOUS

## 2017-01-31 NOTE — Progress Notes (Signed)
*  PRELIMINARY RESULTS* Echocardiogram 2D Echocardiogram has been performed.  Stacey Drain 01/31/2017, 9:13 AM

## 2017-02-01 ENCOUNTER — Telehealth: Payer: Self-pay | Admitting: *Deleted

## 2017-02-01 NOTE — Telephone Encounter (Signed)
Called patient with test results. No answer. Left message to call back.  

## 2017-02-01 NOTE — Telephone Encounter (Signed)
-----   Message from Laqueta Linden, MD sent at 02/01/2017 12:34 PM EDT ----- Normal pumping function. No major valve problems.

## 2017-03-14 ENCOUNTER — Ambulatory Visit: Payer: Medicare Other | Admitting: Cardiovascular Disease

## 2017-03-23 DIAGNOSIS — B880 Other acariasis: Secondary | ICD-10-CM | POA: Diagnosis not present

## 2017-04-12 DIAGNOSIS — R7301 Impaired fasting glucose: Secondary | ICD-10-CM | POA: Diagnosis not present

## 2017-04-12 DIAGNOSIS — E782 Mixed hyperlipidemia: Secondary | ICD-10-CM | POA: Diagnosis not present

## 2017-04-14 DIAGNOSIS — R944 Abnormal results of kidney function studies: Secondary | ICD-10-CM | POA: Diagnosis not present

## 2017-04-14 DIAGNOSIS — N6320 Unspecified lump in the left breast, unspecified quadrant: Secondary | ICD-10-CM | POA: Diagnosis not present

## 2017-04-14 DIAGNOSIS — I10 Essential (primary) hypertension: Secondary | ICD-10-CM | POA: Diagnosis not present

## 2017-04-14 DIAGNOSIS — R011 Cardiac murmur, unspecified: Secondary | ICD-10-CM | POA: Diagnosis not present

## 2017-04-14 DIAGNOSIS — Z23 Encounter for immunization: Secondary | ICD-10-CM | POA: Diagnosis not present

## 2017-04-14 DIAGNOSIS — R7301 Impaired fasting glucose: Secondary | ICD-10-CM | POA: Diagnosis not present

## 2017-04-24 ENCOUNTER — Other Ambulatory Visit (HOSPITAL_COMMUNITY): Payer: Self-pay | Admitting: Internal Medicine

## 2017-04-24 DIAGNOSIS — Z780 Asymptomatic menopausal state: Secondary | ICD-10-CM

## 2017-05-24 DIAGNOSIS — K047 Periapical abscess without sinus: Secondary | ICD-10-CM | POA: Diagnosis not present

## 2017-05-24 DIAGNOSIS — K089 Disorder of teeth and supporting structures, unspecified: Secondary | ICD-10-CM | POA: Diagnosis not present

## 2017-07-27 DIAGNOSIS — H52223 Regular astigmatism, bilateral: Secondary | ICD-10-CM | POA: Diagnosis not present

## 2017-07-27 DIAGNOSIS — H5213 Myopia, bilateral: Secondary | ICD-10-CM | POA: Diagnosis not present

## 2017-07-27 DIAGNOSIS — H524 Presbyopia: Secondary | ICD-10-CM | POA: Diagnosis not present

## 2017-07-27 DIAGNOSIS — H2513 Age-related nuclear cataract, bilateral: Secondary | ICD-10-CM | POA: Diagnosis not present

## 2017-10-04 DIAGNOSIS — I10 Essential (primary) hypertension: Secondary | ICD-10-CM | POA: Diagnosis not present

## 2017-10-04 DIAGNOSIS — E782 Mixed hyperlipidemia: Secondary | ICD-10-CM | POA: Diagnosis not present

## 2017-10-04 DIAGNOSIS — R7301 Impaired fasting glucose: Secondary | ICD-10-CM | POA: Diagnosis not present

## 2017-10-05 DIAGNOSIS — R7301 Impaired fasting glucose: Secondary | ICD-10-CM | POA: Diagnosis not present

## 2017-10-05 DIAGNOSIS — Z720 Tobacco use: Secondary | ICD-10-CM | POA: Diagnosis not present

## 2017-10-05 DIAGNOSIS — Z0001 Encounter for general adult medical examination with abnormal findings: Secondary | ICD-10-CM | POA: Diagnosis not present

## 2017-11-07 ENCOUNTER — Other Ambulatory Visit (HOSPITAL_COMMUNITY): Payer: Self-pay | Admitting: Respiratory Therapy

## 2017-11-07 DIAGNOSIS — J449 Chronic obstructive pulmonary disease, unspecified: Secondary | ICD-10-CM

## 2017-11-21 ENCOUNTER — Ambulatory Visit (HOSPITAL_COMMUNITY)
Admission: RE | Admit: 2017-11-21 | Discharge: 2017-11-21 | Disposition: A | Discharge status: Home / Self Care | Payer: Medicare Other | Source: Ambulatory Visit | Attending: Internal Medicine | Admitting: Internal Medicine

## 2017-11-21 DIAGNOSIS — J449 Chronic obstructive pulmonary disease, unspecified: Secondary | ICD-10-CM

## 2017-11-21 LAB — PULMONARY FUNCTION TEST
DL/VA % PRED: 50 %
DL/VA: 2.66 ml/min/mmHg/L
DLCO UNC: 11.09 ml/min/mmHg
DLCO unc % pred: 37 %
FEF 25-75 Pre: 0.44 L/sec
FEF2575-%PRED-PRE: 20 %
FEV1-%Pred-Pre: 39 %
FEV1-Pre: 1.05 L
FEV1FVC-%Pred-Pre: 73 %
FEV6-%Pred-Pre: 53 %
FEV6-PRE: 1.79 L
FEV6FVC-%PRED-PRE: 99 %
FVC-%Pred-Pre: 53 %
FVC-PRE: 1.88 L
PRE FEV6/FVC RATIO: 95 %
Pre FEV1/FVC ratio: 56 %
RV % pred: 324 %
RV: 7.76 L
TLC % pred: 173 %
TLC: 9.81 L

## 2017-12-07 DIAGNOSIS — Z720 Tobacco use: Secondary | ICD-10-CM | POA: Diagnosis not present

## 2018-04-17 DIAGNOSIS — R7301 Impaired fasting glucose: Secondary | ICD-10-CM | POA: Diagnosis not present

## 2018-04-17 DIAGNOSIS — E782 Mixed hyperlipidemia: Secondary | ICD-10-CM | POA: Diagnosis not present

## 2018-04-20 DIAGNOSIS — R011 Cardiac murmur, unspecified: Secondary | ICD-10-CM | POA: Diagnosis not present

## 2018-04-20 DIAGNOSIS — I10 Essential (primary) hypertension: Secondary | ICD-10-CM | POA: Diagnosis not present

## 2018-04-20 DIAGNOSIS — E782 Mixed hyperlipidemia: Secondary | ICD-10-CM | POA: Diagnosis not present

## 2018-04-20 DIAGNOSIS — R7301 Impaired fasting glucose: Secondary | ICD-10-CM | POA: Diagnosis not present

## 2018-04-20 DIAGNOSIS — R000 Tachycardia, unspecified: Secondary | ICD-10-CM | POA: Diagnosis not present

## 2018-04-23 ENCOUNTER — Other Ambulatory Visit: Payer: Self-pay | Admitting: Internal Medicine

## 2018-04-23 DIAGNOSIS — Z780 Asymptomatic menopausal state: Secondary | ICD-10-CM

## 2018-06-04 ENCOUNTER — Other Ambulatory Visit: Payer: Self-pay | Admitting: Internal Medicine

## 2018-06-04 DIAGNOSIS — Z87898 Personal history of other specified conditions: Secondary | ICD-10-CM

## 2018-07-10 ENCOUNTER — Encounter (HOSPITAL_COMMUNITY): Payer: Self-pay

## 2018-07-10 ENCOUNTER — Ambulatory Visit (HOSPITAL_COMMUNITY): Payer: Medicare Other

## 2018-07-10 ENCOUNTER — Ambulatory Visit (HOSPITAL_COMMUNITY)
Admission: RE | Admit: 2018-07-10 | Discharge: 2018-07-10 | Disposition: A | Discharge status: Home / Self Care | Payer: Medicare Other | Source: Ambulatory Visit | Attending: Internal Medicine | Admitting: Internal Medicine

## 2018-07-10 DIAGNOSIS — Z13820 Encounter for screening for osteoporosis: Secondary | ICD-10-CM | POA: Insufficient documentation

## 2018-07-10 DIAGNOSIS — Z87898 Personal history of other specified conditions: Secondary | ICD-10-CM

## 2018-07-10 DIAGNOSIS — N6342 Unspecified lump in left breast, subareolar: Secondary | ICD-10-CM | POA: Diagnosis not present

## 2018-07-10 DIAGNOSIS — R928 Other abnormal and inconclusive findings on diagnostic imaging of breast: Secondary | ICD-10-CM | POA: Insufficient documentation

## 2018-07-10 DIAGNOSIS — F17200 Nicotine dependence, unspecified, uncomplicated: Secondary | ICD-10-CM | POA: Insufficient documentation

## 2018-07-10 DIAGNOSIS — M85832 Other specified disorders of bone density and structure, left forearm: Secondary | ICD-10-CM | POA: Diagnosis not present

## 2018-07-10 DIAGNOSIS — Z780 Asymptomatic menopausal state: Secondary | ICD-10-CM | POA: Insufficient documentation

## 2018-08-30 ENCOUNTER — Encounter (HOSPITAL_COMMUNITY): Payer: Self-pay | Admitting: Emergency Medicine

## 2018-08-30 ENCOUNTER — Emergency Department (HOSPITAL_COMMUNITY): Payer: Medicare Other

## 2018-08-30 ENCOUNTER — Other Ambulatory Visit: Payer: Self-pay

## 2018-08-30 ENCOUNTER — Observation Stay (HOSPITAL_COMMUNITY)
Admission: EM | Admit: 2018-08-30 | Discharge: 2018-08-30 | Disposition: A | Discharge status: Home / Self Care | Payer: Medicare Other | Attending: Family Medicine | Admitting: Family Medicine

## 2018-08-30 DIAGNOSIS — R2689 Other abnormalities of gait and mobility: Secondary | ICD-10-CM | POA: Diagnosis present

## 2018-08-30 DIAGNOSIS — Z8249 Family history of ischemic heart disease and other diseases of the circulatory system: Secondary | ICD-10-CM | POA: Diagnosis not present

## 2018-08-30 DIAGNOSIS — I10 Essential (primary) hypertension: Secondary | ICD-10-CM | POA: Diagnosis present

## 2018-08-30 DIAGNOSIS — Z880 Allergy status to penicillin: Secondary | ICD-10-CM | POA: Insufficient documentation

## 2018-08-30 DIAGNOSIS — Z8673 Personal history of transient ischemic attack (TIA), and cerebral infarction without residual deficits: Secondary | ICD-10-CM | POA: Diagnosis not present

## 2018-08-30 DIAGNOSIS — Z79899 Other long term (current) drug therapy: Secondary | ICD-10-CM | POA: Diagnosis not present

## 2018-08-30 DIAGNOSIS — Z7982 Long term (current) use of aspirin: Secondary | ICD-10-CM | POA: Insufficient documentation

## 2018-08-30 DIAGNOSIS — R42 Dizziness and giddiness: Secondary | ICD-10-CM | POA: Diagnosis not present

## 2018-08-30 DIAGNOSIS — R270 Ataxia, unspecified: Principal | ICD-10-CM

## 2018-08-30 DIAGNOSIS — F17210 Nicotine dependence, cigarettes, uncomplicated: Secondary | ICD-10-CM | POA: Diagnosis not present

## 2018-08-30 DIAGNOSIS — R4781 Slurred speech: Secondary | ICD-10-CM | POA: Diagnosis not present

## 2018-08-30 DIAGNOSIS — R4701 Aphasia: Secondary | ICD-10-CM | POA: Diagnosis not present

## 2018-08-30 LAB — DIFFERENTIAL
ABS IMMATURE GRANULOCYTES: 0.06 10*3/uL (ref 0.00–0.07)
BASOS ABS: 0 10*3/uL (ref 0.0–0.1)
Basophils Relative: 0 %
EOS ABS: 0.1 10*3/uL (ref 0.0–0.5)
EOS PCT: 1 %
IMMATURE GRANULOCYTES: 1 %
LYMPHS PCT: 25 %
Lymphs Abs: 2.3 10*3/uL (ref 0.7–4.0)
Monocytes Absolute: 0.6 10*3/uL (ref 0.1–1.0)
Monocytes Relative: 7 %
Neutro Abs: 6.1 10*3/uL (ref 1.7–7.7)
Neutrophils Relative %: 66 %

## 2018-08-30 LAB — I-STAT TROPONIN, ED: TROPONIN I, POC: 0.01 ng/mL (ref 0.00–0.08)

## 2018-08-30 LAB — COMPREHENSIVE METABOLIC PANEL
ALK PHOS: 67 U/L (ref 38–126)
ALT: 12 U/L (ref 0–44)
AST: 17 U/L (ref 15–41)
Albumin: 3.7 g/dL (ref 3.5–5.0)
Anion gap: 8 (ref 5–15)
BUN: 21 mg/dL (ref 8–23)
CALCIUM: 9.1 mg/dL (ref 8.9–10.3)
CHLORIDE: 107 mmol/L (ref 98–111)
CO2: 22 mmol/L (ref 22–32)
CREATININE: 1.05 mg/dL — AB (ref 0.44–1.00)
GFR calc Af Amer: >60 mL/min (ref >60)
GFR, EST NON AFRICAN AMERICAN: 53 mL/min — AB (ref >60)
Glucose, Bld: 121 mg/dL — ABNORMAL HIGH (ref 70–99)
Potassium: 3.8 mmol/L (ref 3.5–5.1)
Sodium: 137 mmol/L (ref 135–145)
Total Bilirubin: 0.3 mg/dL (ref 0.3–1.2)
Total Protein: 7.1 g/dL (ref 6.5–8.1)

## 2018-08-30 LAB — CBC
HEMATOCRIT: 39.3 % (ref 36.0–46.0)
Hemoglobin: 12.3 g/dL (ref 12.0–15.0)
MCH: 30.4 pg (ref 26.0–34.0)
MCHC: 31.3 g/dL (ref 30.0–36.0)
MCV: 97 fL (ref 80.0–100.0)
NRBC: 0 % (ref 0.0–0.2)
PLATELETS: 360 10*3/uL (ref 150–400)
RBC: 4.05 MIL/uL (ref 3.87–5.11)
RDW: 12.7 % (ref 11.5–15.5)
WBC: 9.2 10*3/uL (ref 4.0–10.5)

## 2018-08-30 LAB — ETHANOL: Alcohol, Ethyl (B): <10 mg/dL (ref <10)

## 2018-08-30 LAB — URINALYSIS, ROUTINE W REFLEX MICROSCOPIC
BILIRUBIN URINE: NEGATIVE
Glucose, UA: NEGATIVE mg/dL
HGB URINE DIPSTICK: NEGATIVE
Ketones, ur: NEGATIVE mg/dL
Leukocytes, UA: NEGATIVE
NITRITE: NEGATIVE
PROTEIN: NEGATIVE mg/dL
Specific Gravity, Urine: 1.016 (ref 1.005–1.030)
pH: 6 (ref 5.0–8.0)

## 2018-08-30 LAB — RAPID URINE DRUG SCREEN, HOSP PERFORMED
Amphetamines: NOT DETECTED
BARBITURATES: NOT DETECTED
BENZODIAZEPINES: NOT DETECTED
Cocaine: NOT DETECTED
Opiates: NOT DETECTED
Tetrahydrocannabinol: NOT DETECTED

## 2018-08-30 LAB — PROTIME-INR
INR: 0.97
Prothrombin Time: 12.8 seconds (ref 11.4–15.2)

## 2018-08-30 LAB — APTT: aPTT: 30 seconds (ref 24–36)

## 2018-08-30 NOTE — ED Provider Notes (Signed)
[facility] EMERGENCY DEPARTMENT Provider Note   CSN: [id number] Arrival date & time: 08/30/18  1817     History   Chief Complaint Chief Complaint  Patient presents with  . Dizziness  . Aphasia    HPI Tanya Hammond is a 72 y.o. female.  HPI  Patient is a pleasant 72 year old female, she has a known history of hypertension, she still smokes cigarettes, she has a prior ischemic stroke from 20 years ago but has been in her usual state of health until 4 days ago when she awoke from sleep with a feeling of imbalance as well as a feeling of slurred speech which comes on intermittently.  She denies any weakness of the arms or the legs but has had difficulty when she gets up to walk feeling like she has no balance and is falling from side to side.  She only notices the imbalance when she is standing, she has no difficulty using the arms or legs when she is sitting, she has even been able to drive but when she gets up to walk she cannot walk straight.  She has had bouts of nausea associated with this and a persistent feeling of not speaking correctly.  She reports that she lives with her mother who told her that she sounded like she did when she had a stroke many years ago.  The patient does not take any anticoagulants other than aspirin.  He has not been seen by her family doctor prior to this evaluation.  Past Medical History:  Diagnosis Date  . Hypertension   . Multiple allergies     There are no active problems to display for this patient.   Past Surgical History:  Procedure Laterality Date  . APPENDECTOMY    . GALLBLADDER SURGERY    . ortho procedures    . PARTIAL HIP ARTHROPLASTY     bi-lat  . TOTAL ABDOMINAL HYSTERECTOMY       OB History   None      Home Medications    Prior to Admission medications   Medication Sig Start Date End Date Taking? Authorizing Provider  amitriptyline (ELAVIL) 25 MG tablet Take 25 mg by mouth at bedtime.      [provider]    aspirin 325 MG EC tablet Take 325 mg by mouth daily.    [provider]  Calcium Carb-Cholecalciferol 500-400 MG-UNIT CHEW Chew 1 each by mouth daily.      [provider]  loratadine (CLARITIN) 10 MG tablet Take 10 mg by mouth daily.      [provider]  montelukast (SINGULAIR) 10 MG tablet Take 10 mg by mouth at bedtime.      [provider]  nitroGLYCERIN (NITROSTAT) 0.4 MG SL tablet Place 1 tablet (0.4 mg total) under the tongue every 5 (five) minutes as needed. 01/20/17   Laqueta Linden, MD  valsartan-hydrochlorothiazide (DIOVAN HCT) 80-12.5 MG per tablet Take 1 tablet by mouth daily.      [provider]  vitamin B-12 (CYANOCOBALAMIN) 1000 MCG tablet Take 1,000 mcg by mouth daily.      [provider]    Family History Family History  Problem Relation Age of Onset  . Diabetes Mother   . Hypertension Mother   . Alzheimer's disease Mother   . Lung cancer Father     Social History Social History   Tobacco Use  . Smoking status: Current Every Day Smoker    Packs/day: 0.05  Start date: 01/21/1967  . Smokeless tobacco: Never Used  Substance Use Topics  . Alcohol use: Yes  . Drug use: No     Allergies   Penicillins   Review of Systems Review of Systems  All other systems reviewed and are negative.    Physical Exam Updated Vital Signs BP (!) 137/59 (BP Location: Left Arm)   Pulse 90   Temp 98.1 F (36.7 C) (Oral)   Resp (!) 21   Ht 1.727 m (5\' 8" )   Wt 83.9 kg   SpO2 92%   BMI 28.13 kg/m   Physical Exam  Constitutional: She appears well-developed and well-nourished. No distress.  HENT:  Head: Normocephalic and atraumatic.  Mouth/Throat: Oropharynx is clear and moist. No oropharyngeal exudate.  Eyes: Pupils are equal, round, and reactive to light. Conjunctivae and EOM are normal. Right eye exhibits no discharge. Left eye exhibits no discharge. No scleral icterus.  Neck: Normal range of motion.  Neck supple. No JVD present. No thyromegaly present.  Cardiovascular: Normal rate, regular rhythm, normal heart sounds and intact distal pulses. Exam reveals no gallop and no friction rub.  No murmur heard. Pulmonary/Chest: Effort normal and breath sounds normal. No respiratory distress. She has no wheezes. She has no rales.  Abdominal: Soft. Bowel sounds are normal. She exhibits no distension and no mass. There is no tenderness.  Musculoskeletal: Normal range of motion. She exhibits no edema or tenderness.  Lymphadenopathy:    She has no cervical adenopathy.  Neurological: She is alert. Coordination normal.  The patient has anisocoria, her left pupil is larger than the right pupil but both are briskly reactive.  There is no ptosis.  She has normal cranial nerves III through XII otherwise.  Her speech seems slightly slurred but she is able to speak without any difficulty or aphasia.  She has no difficulty with finger-nose-finger and has normal grips, there is 5 out of 5 strength in all 4 extremities, her gait is slightly ataxic, she is unable to walk heel toe and feels like she has to hold onto something when she walks because of the imbalance.  I walked with her in the hallway and she had similar symptoms.  She is able to stand on her toes, stand on her heels but she cannot walk a straight line.  Skin: Skin is warm and dry. No rash noted. No erythema.  Psychiatric: She has a normal mood and affect. Her behavior is normal.  Nursing note and vitals reviewed.    ED Treatments / Results  Labs (all labs ordered are listed, but only abnormal results are displayed) Labs Reviewed - No data to display  EKG EKG Interpretation  Date/Time:  Thursday August 30 2018 18:27:10 EDT Ventricular Rate:  88 PR Interval:    QRS Duration: 91 QT Interval:  397 QTC Calculation: 481 R Axis:   60 Text Interpretation:  Sinus rhythm Probable left atrial enlargement Abnormal R-wave progression, early transition  Borderline repolarization abnormality Baseline wander in lead(s) II aVR No old tracing to compare Confirmed by Eber Hong ([id number]) on 08/30/2018 6:44:33 PM   Radiology No results found.  Procedures Procedures (including critical care time)  Medications Ordered in ED Medications - No data to display   Initial Impression / Assessment and Plan / ED Course  I have reviewed the triage vital signs and the nursing notes.  Pertinent labs & imaging results that were available during my care of the patient were reviewed by me and considered in my  medical decision making (see chart for details).    The patient's exam is consistent with possibly having a posterior circulation ischemic infarct.  She has had symptoms for 4 days which have been constant and that she is not a candidate for thrombectomy or thrombolytic therapy.  At this time she will undergo CT scan, labs and likely admission to the hospital for stroke work-up.  She does not have vertiginous symptoms and denies the feeling of spinning, only that she does not have balance when she walks.  CMP and CBC unremarkable - labs normal  CT negative EKG without arrhythmia Possible ischemica CVA -  D/w Dr. Antionette Char for admission  Vitals:   08/30/18 1830 08/30/18 1954 08/30/18 2000 08/30/18 2030  BP: 119/62 (!) 153/73 (!) 153/69 (!) 172/72  Pulse: 88 77 77 78  Resp: (!) 22 16    Temp:      TempSrc:      SpO2: 91% 97% 96% 96%  Weight:      Height:         Final Clinical Impressions(s) / ED Diagnoses   Final diagnoses:  Ataxia      Eber Hong, MD 08/30/18 2057

## 2018-08-30 NOTE — H&P (Signed)
Patient presents with 5 days of intermittent difficulties with speech and balance. She reports falling towards the right while walking and slurring of speech. Hospitalists asked to admit and I recommended to patient that she be observed in hospital for further eval, including MRI brain, but she does not want to stay, voices understanding of risks and plans to leave AMA.

## 2018-08-30 NOTE — ED Triage Notes (Signed)
Since Monday around 1600 pt has been having intermittent dizziness, difficulty with ambulation, and slurred speech. Denies stroke or TIA hx. No blood thinners. Right now pt c/o slurred speech.

## 2018-08-30 NOTE — ED Notes (Signed)
Patient became source of blood exposure, labs drawn, patient made aware and voices understanding.

## 2018-08-30 NOTE — ED Notes (Signed)
Patient adamant regarding continuing care.  Patient stated she wanted to go home.  Patient signed AMA.  Patient alert, oriented x's4

## 2018-08-31 DIAGNOSIS — I10 Essential (primary) hypertension: Secondary | ICD-10-CM | POA: Diagnosis not present

## 2018-08-31 DIAGNOSIS — R011 Cardiac murmur, unspecified: Secondary | ICD-10-CM | POA: Diagnosis not present

## 2018-08-31 DIAGNOSIS — H6121 Impacted cerumen, right ear: Secondary | ICD-10-CM | POA: Diagnosis not present

## 2018-08-31 DIAGNOSIS — H8110 Benign paroxysmal vertigo, unspecified ear: Secondary | ICD-10-CM | POA: Diagnosis not present

## 2018-08-31 DIAGNOSIS — Z720 Tobacco use: Secondary | ICD-10-CM | POA: Diagnosis not present

## 2018-09-18 DIAGNOSIS — H52223 Regular astigmatism, bilateral: Secondary | ICD-10-CM | POA: Diagnosis not present

## 2018-09-18 DIAGNOSIS — H5213 Myopia, bilateral: Secondary | ICD-10-CM | POA: Diagnosis not present

## 2018-09-18 DIAGNOSIS — H524 Presbyopia: Secondary | ICD-10-CM | POA: Diagnosis not present

## 2018-10-18 DIAGNOSIS — R7301 Impaired fasting glucose: Secondary | ICD-10-CM | POA: Diagnosis not present

## 2018-10-18 DIAGNOSIS — I10 Essential (primary) hypertension: Secondary | ICD-10-CM | POA: Diagnosis not present

## 2018-10-18 DIAGNOSIS — M545 Low back pain: Secondary | ICD-10-CM | POA: Diagnosis not present

## 2018-10-18 DIAGNOSIS — S39012A Strain of muscle, fascia and tendon of lower back, initial encounter: Secondary | ICD-10-CM | POA: Diagnosis not present

## 2018-10-26 DIAGNOSIS — Z23 Encounter for immunization: Secondary | ICD-10-CM | POA: Diagnosis not present

## 2018-10-26 DIAGNOSIS — R944 Abnormal results of kidney function studies: Secondary | ICD-10-CM | POA: Diagnosis not present

## 2018-10-26 DIAGNOSIS — Z0001 Encounter for general adult medical examination with abnormal findings: Secondary | ICD-10-CM | POA: Diagnosis not present

## 2018-10-26 DIAGNOSIS — E782 Mixed hyperlipidemia: Secondary | ICD-10-CM | POA: Diagnosis not present

## 2018-10-26 DIAGNOSIS — I10 Essential (primary) hypertension: Secondary | ICD-10-CM | POA: Diagnosis not present

## 2019-01-24 DIAGNOSIS — M5442 Lumbago with sciatica, left side: Secondary | ICD-10-CM | POA: Diagnosis not present

## 2019-05-02 DIAGNOSIS — R7301 Impaired fasting glucose: Secondary | ICD-10-CM | POA: Diagnosis not present

## 2019-05-02 DIAGNOSIS — I10 Essential (primary) hypertension: Secondary | ICD-10-CM | POA: Diagnosis not present

## 2019-05-02 DIAGNOSIS — E782 Mixed hyperlipidemia: Secondary | ICD-10-CM | POA: Diagnosis not present

## 2019-05-08 DIAGNOSIS — R944 Abnormal results of kidney function studies: Secondary | ICD-10-CM | POA: Diagnosis not present

## 2019-05-08 DIAGNOSIS — I10 Essential (primary) hypertension: Secondary | ICD-10-CM | POA: Diagnosis not present

## 2019-05-08 DIAGNOSIS — R7301 Impaired fasting glucose: Secondary | ICD-10-CM | POA: Diagnosis not present

## 2019-05-08 DIAGNOSIS — R000 Tachycardia, unspecified: Secondary | ICD-10-CM | POA: Diagnosis not present

## 2019-11-15 DIAGNOSIS — R7301 Impaired fasting glucose: Secondary | ICD-10-CM | POA: Diagnosis not present

## 2019-11-15 DIAGNOSIS — I10 Essential (primary) hypertension: Secondary | ICD-10-CM | POA: Diagnosis not present

## 2019-11-15 DIAGNOSIS — E782 Mixed hyperlipidemia: Secondary | ICD-10-CM | POA: Diagnosis not present

## 2019-11-19 DIAGNOSIS — E782 Mixed hyperlipidemia: Secondary | ICD-10-CM | POA: Diagnosis not present

## 2019-11-19 DIAGNOSIS — R7303 Prediabetes: Secondary | ICD-10-CM | POA: Diagnosis not present

## 2019-11-19 DIAGNOSIS — I129 Hypertensive chronic kidney disease with stage 1 through stage 4 chronic kidney disease, or unspecified chronic kidney disease: Secondary | ICD-10-CM | POA: Diagnosis not present

## 2019-11-19 DIAGNOSIS — R7301 Impaired fasting glucose: Secondary | ICD-10-CM | POA: Diagnosis not present

## 2019-11-26 DIAGNOSIS — M542 Cervicalgia: Secondary | ICD-10-CM | POA: Diagnosis not present

## 2020-05-08 DIAGNOSIS — N1831 Chronic kidney disease, stage 3a: Secondary | ICD-10-CM | POA: Diagnosis not present

## 2020-05-08 DIAGNOSIS — I10 Essential (primary) hypertension: Secondary | ICD-10-CM | POA: Diagnosis not present

## 2020-05-08 DIAGNOSIS — M545 Low back pain: Secondary | ICD-10-CM | POA: Diagnosis not present

## 2020-05-08 DIAGNOSIS — S39012A Strain of muscle, fascia and tendon of lower back, initial encounter: Secondary | ICD-10-CM | POA: Diagnosis not present

## 2020-05-08 DIAGNOSIS — R7301 Impaired fasting glucose: Secondary | ICD-10-CM | POA: Diagnosis not present

## 2020-05-18 DIAGNOSIS — R7303 Prediabetes: Secondary | ICD-10-CM | POA: Diagnosis not present

## 2020-05-18 DIAGNOSIS — Z0001 Encounter for general adult medical examination with abnormal findings: Secondary | ICD-10-CM | POA: Diagnosis not present

## 2020-05-18 DIAGNOSIS — E782 Mixed hyperlipidemia: Secondary | ICD-10-CM | POA: Diagnosis not present

## 2020-05-18 DIAGNOSIS — R7301 Impaired fasting glucose: Secondary | ICD-10-CM | POA: Diagnosis not present

## 2020-05-18 DIAGNOSIS — I129 Hypertensive chronic kidney disease with stage 1 through stage 4 chronic kidney disease, or unspecified chronic kidney disease: Secondary | ICD-10-CM | POA: Diagnosis not present

## 2020-11-23 DIAGNOSIS — N1831 Chronic kidney disease, stage 3a: Secondary | ICD-10-CM | POA: Diagnosis not present

## 2020-11-23 DIAGNOSIS — I10 Essential (primary) hypertension: Secondary | ICD-10-CM | POA: Diagnosis not present

## 2020-11-23 DIAGNOSIS — R7301 Impaired fasting glucose: Secondary | ICD-10-CM | POA: Diagnosis not present

## 2020-11-23 DIAGNOSIS — S39012A Strain of muscle, fascia and tendon of lower back, initial encounter: Secondary | ICD-10-CM | POA: Diagnosis not present

## 2020-11-23 DIAGNOSIS — E870 Hyperosmolality and hypernatremia: Secondary | ICD-10-CM | POA: Diagnosis not present

## 2020-11-25 DIAGNOSIS — E782 Mixed hyperlipidemia: Secondary | ICD-10-CM | POA: Diagnosis not present

## 2020-11-25 DIAGNOSIS — R7301 Impaired fasting glucose: Secondary | ICD-10-CM | POA: Diagnosis not present

## 2020-11-25 DIAGNOSIS — J302 Other seasonal allergic rhinitis: Secondary | ICD-10-CM | POA: Diagnosis not present

## 2020-11-25 DIAGNOSIS — Z23 Encounter for immunization: Secondary | ICD-10-CM | POA: Diagnosis not present

## 2020-11-25 DIAGNOSIS — M8580 Other specified disorders of bone density and structure, unspecified site: Secondary | ICD-10-CM | POA: Diagnosis not present

## 2020-11-25 DIAGNOSIS — M5431 Sciatica, right side: Secondary | ICD-10-CM | POA: Diagnosis not present

## 2020-11-25 DIAGNOSIS — Z0001 Encounter for general adult medical examination with abnormal findings: Secondary | ICD-10-CM | POA: Diagnosis not present

## 2020-11-25 DIAGNOSIS — R7303 Prediabetes: Secondary | ICD-10-CM | POA: Diagnosis not present

## 2020-11-25 DIAGNOSIS — F17210 Nicotine dependence, cigarettes, uncomplicated: Secondary | ICD-10-CM | POA: Diagnosis not present

## 2020-11-25 DIAGNOSIS — I129 Hypertensive chronic kidney disease with stage 1 through stage 4 chronic kidney disease, or unspecified chronic kidney disease: Secondary | ICD-10-CM | POA: Diagnosis not present

## 2020-12-28 DIAGNOSIS — E782 Mixed hyperlipidemia: Secondary | ICD-10-CM | POA: Diagnosis not present

## 2020-12-28 DIAGNOSIS — J302 Other seasonal allergic rhinitis: Secondary | ICD-10-CM | POA: Diagnosis not present

## 2020-12-28 DIAGNOSIS — M8580 Other specified disorders of bone density and structure, unspecified site: Secondary | ICD-10-CM | POA: Diagnosis not present

## 2020-12-28 DIAGNOSIS — I129 Hypertensive chronic kidney disease with stage 1 through stage 4 chronic kidney disease, or unspecified chronic kidney disease: Secondary | ICD-10-CM | POA: Diagnosis not present

## 2020-12-28 DIAGNOSIS — F17210 Nicotine dependence, cigarettes, uncomplicated: Secondary | ICD-10-CM | POA: Diagnosis not present

## 2020-12-28 DIAGNOSIS — M5431 Sciatica, right side: Secondary | ICD-10-CM | POA: Diagnosis not present

## 2021-01-20 DIAGNOSIS — H25813 Combined forms of age-related cataract, bilateral: Secondary | ICD-10-CM | POA: Diagnosis not present

## 2021-01-20 DIAGNOSIS — H524 Presbyopia: Secondary | ICD-10-CM | POA: Diagnosis not present

## 2021-01-20 DIAGNOSIS — H52223 Regular astigmatism, bilateral: Secondary | ICD-10-CM | POA: Diagnosis not present

## 2021-01-20 DIAGNOSIS — H2129 Other iris atrophy: Secondary | ICD-10-CM | POA: Diagnosis not present

## 2021-01-20 DIAGNOSIS — H5213 Myopia, bilateral: Secondary | ICD-10-CM | POA: Diagnosis not present

## 2021-01-20 DIAGNOSIS — H5702 Anisocoria: Secondary | ICD-10-CM | POA: Diagnosis not present

## 2021-03-30 DIAGNOSIS — H01001 Unspecified blepharitis right upper eyelid: Secondary | ICD-10-CM | POA: Diagnosis not present

## 2021-03-30 DIAGNOSIS — H2511 Age-related nuclear cataract, right eye: Secondary | ICD-10-CM | POA: Diagnosis not present

## 2021-03-30 DIAGNOSIS — H01002 Unspecified blepharitis right lower eyelid: Secondary | ICD-10-CM | POA: Diagnosis not present

## 2021-03-30 DIAGNOSIS — H01004 Unspecified blepharitis left upper eyelid: Secondary | ICD-10-CM | POA: Diagnosis not present

## 2021-03-30 DIAGNOSIS — H25812 Combined forms of age-related cataract, left eye: Secondary | ICD-10-CM | POA: Diagnosis not present

## 2021-05-12 DIAGNOSIS — M545 Low back pain, unspecified: Secondary | ICD-10-CM | POA: Diagnosis not present

## 2021-05-12 DIAGNOSIS — I10 Essential (primary) hypertension: Secondary | ICD-10-CM | POA: Diagnosis not present

## 2021-05-12 DIAGNOSIS — N1831 Chronic kidney disease, stage 3a: Secondary | ICD-10-CM | POA: Diagnosis not present

## 2021-05-12 DIAGNOSIS — S39012A Strain of muscle, fascia and tendon of lower back, initial encounter: Secondary | ICD-10-CM | POA: Diagnosis not present

## 2021-05-12 DIAGNOSIS — R7301 Impaired fasting glucose: Secondary | ICD-10-CM | POA: Diagnosis not present

## 2021-05-24 DIAGNOSIS — H25812 Combined forms of age-related cataract, left eye: Secondary | ICD-10-CM | POA: Diagnosis not present

## 2021-05-24 NOTE — H&P (Signed)
Surgical History & Physical  Patient Name: Tanya Hammond DOB: 08-22-1946  Surgery: Cataract extraction with intraocular lens implant phacoemulsification; Left Eye  Surgeon: [name] MD Surgery Date:  05/28/2021 Pre-Op Date:  05/24/2021  HPI: A 33 Yr. old female patient Pt referred by Dr. Laural Benes for cataract evaluation. The patient complains of difficulty when viewing TV, reading closed caption, news scrolls on TV, which began 1 year ago. Both eyes are affected. The condition's severity is worsening. The complaint is associated with blurry vision and glare. Pt states glare from night driving has been very bothersome. Symptoms are negatively affecting pt's quality of life. Pt does not use any eye drops. Pt denies any floaters/flashes of light HPI was performed by [name] .  Medical History: Cataracts Heterochromia and anisocoria Allergies, sleeping difficulties Chronic Hip Pain  Review of Systems High Blood Pressure Negative Allergic/Immunologic Negative Cardiovascular Negative Constitutional Negative Ear, Nose, Mouth & Throat Negative Endocrine Negative Eyes Negative Gastrointestinal Negative Genitourinary Negative Hemotologic/Lymphatic Negative Integumentary Negative Musculoskeletal Negative Neurological Negative Psychiatry Negative Respiratory  Social   Current every day smoker   Medication Ibuprofen, Losartan, Zyrtec, Amitriptyline, Vitamin D, Methocarbamol, Montelukast, Vitamin B-12, Methocarbamol, Montelukast, Vitamin B-12,   Sx/Procedures Ear drum repair, Hysterectomy, Hip Replacement, Ear drum repair, Hysterectomy, Hip Replacement,   Drug Allergies  Pencillin,   History & Physical: Heent:  Cataract, Left eye NECK: supple without bruits LUNGS: lungs clear to auscultation CV: regular rate and rhythm Abdomen: soft and non-tender Impression & Plan: Assessment: 1.  COMBINED FORMS AGE RELATED CATARACT; Left Eye (H25.812) 2.  BLEPHARITIS; Right Upper  Lid, Right Lower Lid, Left Upper Lid, Left Lower Lid (H01.001, H01.002,H01.004,H01.005) 3.  Pinguecula; Both Eyes (H11.153) 4.  NUCLEAR SCLEROSIS AGE RELATED; Right Eye (H25.11) 5.  DERMATOCHALASIS; Right Upper Lid, Left Upper Lid (H02.831, H02.834) 6.  ASTIGMATISM, REGULAR; Both Eyes (H52.223)  Plan: 1.  Cataract accounts for the patient's decreased vision. This visual impairment is not correctable with a tolerable change in glasses or contact lenses. Cataract surgery with an implantation of a new lens should significantly improve the visual and functional status of the patient. Discussed all risks, benefits, alternatives, and potential complications. Discussed the procedures and recovery. Patient desires to have surgery. A-scan ordered and performed today for intra-ocular lens calculations. The surgery will be performed in order to improve vision for driving, reading, and for eye examinations. Recommend phacoemulsification with intra-ocular lens. Recommend Dextenza for post-operative pain and inflammation. Left Eye. Dilates well - shugarcaine by protocol. Toric Lens.  2.  Recommend regular lid cleaning.  3.  Observe; Artificial tears as needed for irritation.  4.  Will address after OS.  5.  Symptomatic, recommend bilateral blepharoplasty. Findings, prognosis and treatment options reviewed.  6.  Worse OS - toric IOL.

## 2021-05-25 ENCOUNTER — Encounter (HOSPITAL_COMMUNITY)
Admission: RE | Admit: 2021-05-25 | Discharge: 2021-05-25 | Disposition: A | Discharge status: Home / Self Care | Payer: Medicare Other | Source: Ambulatory Visit | Attending: Ophthalmology | Admitting: Ophthalmology

## 2021-05-25 ENCOUNTER — Other Ambulatory Visit: Payer: Self-pay

## 2021-05-26 DIAGNOSIS — E559 Vitamin D deficiency, unspecified: Secondary | ICD-10-CM | POA: Diagnosis not present

## 2021-05-26 DIAGNOSIS — F17200 Nicotine dependence, unspecified, uncomplicated: Secondary | ICD-10-CM | POA: Diagnosis not present

## 2021-05-26 DIAGNOSIS — R079 Chest pain, unspecified: Secondary | ICD-10-CM | POA: Diagnosis not present

## 2021-05-26 DIAGNOSIS — F5101 Primary insomnia: Secondary | ICD-10-CM | POA: Diagnosis not present

## 2021-05-26 DIAGNOSIS — R7301 Impaired fasting glucose: Secondary | ICD-10-CM | POA: Diagnosis not present

## 2021-05-26 DIAGNOSIS — I129 Hypertensive chronic kidney disease with stage 1 through stage 4 chronic kidney disease, or unspecified chronic kidney disease: Secondary | ICD-10-CM | POA: Diagnosis not present

## 2021-05-26 DIAGNOSIS — N1831 Chronic kidney disease, stage 3a: Secondary | ICD-10-CM | POA: Diagnosis not present

## 2021-05-26 DIAGNOSIS — J302 Other seasonal allergic rhinitis: Secondary | ICD-10-CM | POA: Diagnosis not present

## 2021-05-26 DIAGNOSIS — E782 Mixed hyperlipidemia: Secondary | ICD-10-CM | POA: Diagnosis not present

## 2021-05-28 ENCOUNTER — Encounter (HOSPITAL_COMMUNITY)
Admission: RE | Disposition: A | Discharge status: Home / Self Care | Payer: Self-pay | Source: Home / Self Care | Attending: Ophthalmology

## 2021-05-28 ENCOUNTER — Encounter (HOSPITAL_COMMUNITY): Payer: Self-pay | Admitting: Ophthalmology

## 2021-05-28 ENCOUNTER — Ambulatory Visit (HOSPITAL_COMMUNITY)
Admission: RE | Admit: 2021-05-28 | Discharge: 2021-05-28 | Disposition: A | Discharge status: Home / Self Care | Payer: Medicare Other | Attending: Ophthalmology | Admitting: Ophthalmology

## 2021-05-28 ENCOUNTER — Ambulatory Visit (HOSPITAL_COMMUNITY): Payer: Medicare Other | Admitting: Anesthesiology

## 2021-05-28 DIAGNOSIS — H52223 Regular astigmatism, bilateral: Secondary | ICD-10-CM | POA: Insufficient documentation

## 2021-05-28 DIAGNOSIS — H25812 Combined forms of age-related cataract, left eye: Secondary | ICD-10-CM | POA: Insufficient documentation

## 2021-05-28 DIAGNOSIS — H11153 Pinguecula, bilateral: Secondary | ICD-10-CM | POA: Diagnosis not present

## 2021-05-28 DIAGNOSIS — F17210 Nicotine dependence, cigarettes, uncomplicated: Secondary | ICD-10-CM | POA: Diagnosis not present

## 2021-05-28 DIAGNOSIS — F17200 Nicotine dependence, unspecified, uncomplicated: Secondary | ICD-10-CM | POA: Insufficient documentation

## 2021-05-28 DIAGNOSIS — H02834 Dermatochalasis of left upper eyelid: Secondary | ICD-10-CM | POA: Insufficient documentation

## 2021-05-28 DIAGNOSIS — H02831 Dermatochalasis of right upper eyelid: Secondary | ICD-10-CM | POA: Diagnosis not present

## 2021-05-28 DIAGNOSIS — Z96649 Presence of unspecified artificial hip joint: Secondary | ICD-10-CM | POA: Insufficient documentation

## 2021-05-28 DIAGNOSIS — H0100A Unspecified blepharitis right eye, upper and lower eyelids: Secondary | ICD-10-CM | POA: Insufficient documentation

## 2021-05-28 DIAGNOSIS — H0100B Unspecified blepharitis left eye, upper and lower eyelids: Secondary | ICD-10-CM | POA: Diagnosis not present

## 2021-05-28 DIAGNOSIS — H2511 Age-related nuclear cataract, right eye: Secondary | ICD-10-CM | POA: Insufficient documentation

## 2021-05-28 HISTORY — PX: CATARACT EXTRACTION W/PHACO: SHX586

## 2021-05-28 SURGERY — PHACOEMULSIFICATION, CATARACT, WITH IOL INSERTION
Anesthesia: Monitor Anesthesia Care | Site: Eye | Laterality: Left

## 2021-05-28 MED ORDER — BSS IO SOLN
INTRAOCULAR | Status: DC | PRN
  Administered 2021-05-28: 15 mL via INTRAOCULAR

## 2021-05-28 MED ORDER — EPINEPHRINE PF 1 MG/ML IJ SOLN
INTRAMUSCULAR | Status: AC
  Filled 2021-05-28: qty 2

## 2021-05-28 MED ORDER — SODIUM HYALURONATE 10 MG/ML IO SOLUTION
PREFILLED_SYRINGE | INTRAOCULAR | Status: DC | PRN
  Administered 2021-05-28: 0.85 mL via INTRAOCULAR

## 2021-05-28 MED ORDER — NEOMYCIN-POLYMYXIN-DEXAMETH 3.5-10000-0.1 OP SUSP
OPHTHALMIC | Status: DC | PRN
  Administered 2021-05-28: 1 [drp] via OPHTHALMIC

## 2021-05-28 MED ORDER — LIDOCAINE HCL 3.5 % OP GEL
1.0000 "application " | Freq: Once | OPHTHALMIC | Status: AC
  Administered 2021-05-28: 1 via OPHTHALMIC

## 2021-05-28 MED ORDER — SODIUM HYALURONATE 23MG/ML IO SOSY
PREFILLED_SYRINGE | INTRAOCULAR | Status: DC | PRN
  Administered 2021-05-28: 0.6 mL via INTRAOCULAR

## 2021-05-28 MED ORDER — LIDOCAINE HCL (PF) 1 % IJ SOLN
INTRAOCULAR | Status: DC | PRN
  Administered 2021-05-28: 1 mL via OPHTHALMIC

## 2021-05-28 MED ORDER — STERILE WATER FOR IRRIGATION IR SOLN
Status: DC | PRN
  Administered 2021-05-28: 250 mL

## 2021-05-28 MED ORDER — EPINEPHRINE PF 1 MG/ML IJ SOLN
INTRAOCULAR | Status: DC | PRN
  Administered 2021-05-28: 500 mL

## 2021-05-28 MED ORDER — POVIDONE-IODINE 5 % OP SOLN
OPHTHALMIC | Status: DC | PRN
  Administered 2021-05-28: 1 via OPHTHALMIC

## 2021-05-28 MED ORDER — PHENYLEPHRINE HCL 2.5 % OP SOLN
1.0000 [drp] | OPHTHALMIC | Status: AC | PRN
  Administered 2021-05-28 (×3): 1 [drp] via OPHTHALMIC

## 2021-05-28 MED ORDER — TROPICAMIDE 1 % OP SOLN
1.0000 [drp] | OPHTHALMIC | Status: AC
  Administered 2021-05-28 (×3): 1 [drp] via OPHTHALMIC

## 2021-05-28 MED ORDER — TETRACAINE HCL 0.5 % OP SOLN
1.0000 [drp] | OPHTHALMIC | Status: AC | PRN
  Administered 2021-05-28 (×3): 1 [drp] via OPHTHALMIC

## 2021-05-28 SURGICAL SUPPLY — 10 items
CLOTH BEACON ORANGE TIMEOUT ST (SAFETY) ×2 IMPLANT
EYE SHIELD UNIVERSAL CLEAR (GAUZE/BANDAGES/DRESSINGS) ×2 IMPLANT
GLOVE SURG UNDER POLY LF SZ7 (GLOVE) ×4 IMPLANT
NEEDLE HYPO 18GX1.5 BLUNT FILL (NEEDLE) ×2 IMPLANT
PAD ARMBOARD 7.5X6 YLW CONV (MISCELLANEOUS) ×2 IMPLANT
SYR TB 1ML LL NO SAFETY (SYRINGE) ×2 IMPLANT
TAPE SURG TRANSPORE 1 IN (GAUZE/BANDAGES/DRESSINGS) ×1 IMPLANT
TAPE SURGICAL TRANSPORE 1 IN (GAUZE/BANDAGES/DRESSINGS) ×1
TECHNIS 1-PIECE IOL (Intraocular Lens) ×2 IMPLANT
WATER STERILE IRR 250ML POUR (IV SOLUTION) ×2 IMPLANT

## 2021-05-28 NOTE — Op Note (Signed)
Date of procedure: 05/28/21  Pre-operative diagnosis: Visually significant age-related combined cataract, Left Eye (H25.812)  Post-operative diagnosis: Visually significant age-related combined cataract, Left Eye (H25.812)  Procedure: Removal of cataract via phacoemulsification and insertion of intra-ocular lens Wynetta Emery and [place]  +17.0D into the capsular bag of the Left Eye  Attending surgeon: Gerda Diss. Nikisha Fleece, MD, MA  Anesthesia: MAC, Topical Akten  Complications: None  Estimated Blood Loss: <57m (minimal)  Specimens: None  Implants: As above  Indications:  Visually significant age-related cataract, Left Eye  Procedure:  The patient was seen and identified in the pre-operative area. The operative eye was identified and dilated.  The operative eye was marked.  Topical anesthesia was administered to the operative eye.     The patient was then to the operative suite and placed in the supine position.  A timeout was performed confirming the patient, procedure to be performed, and all other relevant information.   The patient's face was prepped and draped in the usual fashion for intra-ocular surgery.  A lid speculum was placed into the operative eye and the surgical microscope moved into place and focused.  An inferotemporal paracentesis was created using a 20 gauge paracentesis blade.  Shugarcaine was injected into the anterior chamber.  Viscoelastic was injected into the anterior chamber.  A temporal clear-corneal main wound incision was created using a 2.430mmicrokeratome.  A continuous curvilinear capsulorrhexis was initiated using an irrigating cystitome and completed using capsulorrhexis forceps.  Hydrodissection and hydrodeliniation were performed.  Viscoelastic was injected into the anterior chamber.  A phacoemulsification handpiece and a chopper as a second instrument were used to remove the nucleus and epinucleus. The irrigation/aspiration handpiece was used to remove  any remaining cortical material.   The capsular bag was reinflated with viscoelastic, checked, and found to be intact.  The intraocular lens was inserted into the capsular bag.  The irrigation/aspiration handpiece was used to remove any remaining viscoelastic.  The clear corneal wound and paracentesis wounds were then hydrated and checked with Weck-Cels to be watertight.  The lid-speculum was removed.  The drape was removed.  The patient's face was cleaned with a wet and dry 4x4.   Maxitrol was instilled in the eye. A clear shield was taped over the eye. The patient was taken to the post-operative care unit in good condition, having tolerated the procedure well.  Post-Op Instructions: [facility] and will receive all other orders and instructions.

## 2021-05-28 NOTE — Interval H&P Note (Signed)
History and Physical Interval Note:  05/28/2021 12:55 PM  Tanya Hammond  has presented today for surgery, with the diagnosis of Nuclear sclerotic cataract - Left eye.  The various methods of treatment have been discussed with the patient and family. After consideration of risks, benefits and other options for treatment, the patient has consented to  Procedure(s) with comments: CATARACT EXTRACTION PHACO AND INTRAOCULAR LENS PLACEMENT (IOC) (Left) - left as a surgical intervention.  The patient's history has been reviewed, patient examined, no change in status, stable for surgery.  I have reviewed the patient's chart and labs.  Questions were answered to the patient's satisfaction.     [name]

## 2021-05-28 NOTE — Discharge Instructions (Signed)
Please discharge patient when stable, will follow up today with Dr. Alvino Lechuga at the [facility] office immediately following discharge.  Leave shield in place until visit.  All paperwork with discharge instructions will be given at the office.  [facility] Address:  [address]  , [place] [address]  

## 2021-05-28 NOTE — Transfer of Care (Signed)
Immediate Anesthesia Transfer of Care Note  Patient: Tanya Hammond  Procedure(s) Performed: CATARACT EXTRACTION PHACO AND INTRAOCULAR LENS PLACEMENT (IOC) (Left: Eye)  Patient Location: Short Stay  Anesthesia Type:MAC  Level of Consciousness: awake  Airway & Oxygen Therapy: Patient Spontanous Breathing  Post-op Assessment: Report given to RN  Post vital signs: Reviewed  Last Vitals:  Vitals Value Taken Time  BP    Temp    Pulse    Resp    SpO2      Last Pain:  Vitals:   05/28/21 1115  PainSc: 0-No pain         Complications: No notable events documented.

## 2021-05-28 NOTE — Anesthesia Postprocedure Evaluation (Signed)
Anesthesia Post Note  Patient: Tanya Hammond  Procedure(s) Performed: CATARACT EXTRACTION PHACO AND INTRAOCULAR LENS PLACEMENT (IOC) (Left: Eye)  Patient location during evaluation: Short Stay Anesthesia Type: MAC Level of consciousness: awake and alert Pain management: pain level controlled Vital Signs Assessment: post-procedure vital signs reviewed and stable Respiratory status: spontaneous breathing Cardiovascular status: blood pressure returned to baseline and stable Postop Assessment: no apparent nausea or vomiting Anesthetic complications: no   No notable events documented.   Last Vitals:  Vitals:   05/28/21 1115 05/28/21 1320  BP: (!) 122/53 (!) 157/71  Pulse: (!) 58 64  Resp: 17 16  Temp: 36.6 C 36.7 C  SpO2: 95% 96%    Last Pain:  Vitals:   05/28/21 1320  TempSrc: Axillary  PainSc: 0-No pain                 Thetis Schwimmer

## 2021-05-28 NOTE — Anesthesia Preprocedure Evaluation (Signed)
Anesthesia Evaluation  Patient identified by MRN, date of birth, ID band Patient awake    Reviewed: Allergy & Precautions, H&P , NPO status , Patient's Chart, lab work & pertinent test results, reviewed documented beta blocker date and time   Airway Mallampati: II  TM Distance: >3 FB Neck ROM: full    Dental no notable dental hx.    Pulmonary neg pulmonary ROS, Current Smoker,    Pulmonary exam normal breath sounds clear to auscultation       Cardiovascular Exercise Tolerance: Good hypertension, negative cardio ROS   Rhythm:regular Rate:Normal     Neuro/Psych negative neurological ROS  negative psych ROS   GI/Hepatic negative GI ROS, Neg liver ROS,   Endo/Other  negative endocrine ROS  Renal/GU negative Renal ROS  negative genitourinary   Musculoskeletal   Abdominal   Peds  Hematology negative hematology ROS (+)   Anesthesia Other Findings   Reproductive/Obstetrics negative OB ROS                             Anesthesia Physical Anesthesia Plan  ASA: 2  Anesthesia Plan: MAC   Post-op Pain Management:    Induction:   PONV Risk Score and Plan:   Airway Management Planned:   Additional Equipment:   Intra-op Plan:   Post-operative Plan:   Informed Consent: I have reviewed the patients History and Physical, chart, labs and discussed the procedure including the risks, benefits and alternatives for the proposed anesthesia with the patient or authorized representative who has indicated his/her understanding and acceptance.     Dental Advisory Given  Plan Discussed with: CRNA  Anesthesia Plan Comments:         Anesthesia Quick Evaluation

## 2021-05-31 ENCOUNTER — Encounter (HOSPITAL_COMMUNITY): Payer: Self-pay | Admitting: Ophthalmology

## 2021-06-18 ENCOUNTER — Encounter (HOSPITAL_COMMUNITY): Payer: Medicare Other

## 2021-06-30 DIAGNOSIS — I10 Essential (primary) hypertension: Secondary | ICD-10-CM | POA: Diagnosis not present

## 2021-06-30 DIAGNOSIS — E782 Mixed hyperlipidemia: Secondary | ICD-10-CM | POA: Diagnosis not present

## 2021-07-12 DIAGNOSIS — L259 Unspecified contact dermatitis, unspecified cause: Secondary | ICD-10-CM | POA: Diagnosis not present

## 2021-07-27 ENCOUNTER — Encounter (HOSPITAL_COMMUNITY): Payer: Medicare Other

## 2021-07-30 DIAGNOSIS — E782 Mixed hyperlipidemia: Secondary | ICD-10-CM | POA: Diagnosis not present

## 2021-07-30 DIAGNOSIS — I10 Essential (primary) hypertension: Secondary | ICD-10-CM | POA: Diagnosis not present

## 2021-08-30 DIAGNOSIS — I10 Essential (primary) hypertension: Secondary | ICD-10-CM | POA: Diagnosis not present

## 2021-08-30 DIAGNOSIS — E782 Mixed hyperlipidemia: Secondary | ICD-10-CM | POA: Diagnosis not present

## 2021-09-01 DIAGNOSIS — R7301 Impaired fasting glucose: Secondary | ICD-10-CM | POA: Diagnosis not present

## 2021-09-01 DIAGNOSIS — Z23 Encounter for immunization: Secondary | ICD-10-CM | POA: Diagnosis not present

## 2021-09-01 DIAGNOSIS — F5101 Primary insomnia: Secondary | ICD-10-CM | POA: Diagnosis not present

## 2021-09-01 DIAGNOSIS — N1831 Chronic kidney disease, stage 3a: Secondary | ICD-10-CM | POA: Diagnosis not present

## 2021-09-01 DIAGNOSIS — F17200 Nicotine dependence, unspecified, uncomplicated: Secondary | ICD-10-CM | POA: Diagnosis not present

## 2021-09-01 DIAGNOSIS — E559 Vitamin D deficiency, unspecified: Secondary | ICD-10-CM | POA: Diagnosis not present

## 2021-09-01 DIAGNOSIS — E782 Mixed hyperlipidemia: Secondary | ICD-10-CM | POA: Diagnosis not present

## 2021-09-01 DIAGNOSIS — R079 Chest pain, unspecified: Secondary | ICD-10-CM | POA: Diagnosis not present

## 2021-09-01 DIAGNOSIS — J302 Other seasonal allergic rhinitis: Secondary | ICD-10-CM | POA: Diagnosis not present

## 2021-09-01 DIAGNOSIS — I129 Hypertensive chronic kidney disease with stage 1 through stage 4 chronic kidney disease, or unspecified chronic kidney disease: Secondary | ICD-10-CM | POA: Diagnosis not present

## 2021-09-17 ENCOUNTER — Encounter (HOSPITAL_COMMUNITY): Payer: Medicare Other

## 2021-09-27 ENCOUNTER — Ambulatory Visit: Admit: 2021-09-27 | Payer: Medicare Other | Admitting: Ophthalmology

## 2021-09-27 SURGERY — PHACOEMULSIFICATION, CATARACT, WITH IOL INSERTION
Anesthesia: Monitor Anesthesia Care | Laterality: Right

## 2021-11-29 DIAGNOSIS — R7301 Impaired fasting glucose: Secondary | ICD-10-CM | POA: Diagnosis not present

## 2021-11-29 DIAGNOSIS — E782 Mixed hyperlipidemia: Secondary | ICD-10-CM | POA: Diagnosis not present

## 2021-12-03 ENCOUNTER — Other Ambulatory Visit (HOSPITAL_COMMUNITY): Payer: Self-pay | Admitting: Family Medicine

## 2021-12-03 DIAGNOSIS — F5101 Primary insomnia: Secondary | ICD-10-CM | POA: Diagnosis not present

## 2021-12-03 DIAGNOSIS — J302 Other seasonal allergic rhinitis: Secondary | ICD-10-CM | POA: Diagnosis not present

## 2021-12-03 DIAGNOSIS — I129 Hypertensive chronic kidney disease with stage 1 through stage 4 chronic kidney disease, or unspecified chronic kidney disease: Secondary | ICD-10-CM | POA: Diagnosis not present

## 2021-12-03 DIAGNOSIS — F17200 Nicotine dependence, unspecified, uncomplicated: Secondary | ICD-10-CM | POA: Diagnosis not present

## 2021-12-03 DIAGNOSIS — Z1231 Encounter for screening mammogram for malignant neoplasm of breast: Secondary | ICD-10-CM

## 2021-12-03 DIAGNOSIS — N1831 Chronic kidney disease, stage 3a: Secondary | ICD-10-CM | POA: Diagnosis not present

## 2021-12-03 DIAGNOSIS — R079 Chest pain, unspecified: Secondary | ICD-10-CM | POA: Diagnosis not present

## 2021-12-03 DIAGNOSIS — E782 Mixed hyperlipidemia: Secondary | ICD-10-CM | POA: Diagnosis not present

## 2021-12-03 DIAGNOSIS — R7301 Impaired fasting glucose: Secondary | ICD-10-CM | POA: Diagnosis not present

## 2021-12-16 DIAGNOSIS — H25811 Combined forms of age-related cataract, right eye: Secondary | ICD-10-CM | POA: Diagnosis not present

## 2021-12-20 ENCOUNTER — Encounter (HOSPITAL_COMMUNITY)
Admission: RE | Admit: 2021-12-20 | Discharge: 2021-12-20 | Disposition: A | Discharge status: Home / Self Care | Payer: Medicare Other | Source: Ambulatory Visit | Attending: Ophthalmology | Admitting: Ophthalmology

## 2021-12-24 NOTE — H&P (Signed)
Surgical History & Physical  Patient Name: Tanya Hammond DOB: [birth year]  Surgery: Cataract extraction with intraocular lens implant phacoemulsification; Right Eye  Surgeon: [name] MD Surgery Date:  12-27-21 Pre-Op Date:  12-20-21  HPI: A 16 Yr. old female patient 1. The patient is returning after cataract post-op. The left eye is affected. Status post cataract post-op, which began 4 monhts ago: Since the last visit, the affected area is doing well. The patient's vision is stable. Patient is following medication instructions. 2. 2. The patient complains of difficulty when cloudy vision, which began 2 years ago. The right eye is affected. The episode is gradual. The condition's severity increased since last visit. Symptoms occur when the patient is inside and outside. The complaint is associated with glare. This is negatively affecting the patient's quality of life and the patient is unable to function adequately in life with the current level of vision. HPI Completed by Dr. [name]  Medical History: Cataracts Heterochromia and anisocoria Macula Degeneration Glaucoma Allergies, sleeping difficulties Chronic Hip Pain High Blood Pressure  Review of Systems Negative Allergic/Immunologic Negative Cardiovascular Negative Constitutional Negative Ear, Nose, Mouth & Throat Negative Endocrine Negative Eyes Negative Gastrointestinal Negative Genitourinary Negative Hemotologic/Lymphatic Negative Integumentary Negative Musculoskeletal Negative Neurological Negative Psychiatry Negative Respiratory  Social   Current every day smoker   Medication Prednisolone-Moxifloxacin-Bromfenac, Ibuprofen, Losartan, Zyrtec, Amitriptyline, Vitamin D, Methocarbamol, Montelukast, Vitamin B-12, Methocarbamol, Montelukast, Vitamin B-12,   Sx/Procedures Phaco c IOL OS, Ear drum repair, Hysterectomy, Hip Replacement, Ear drum repair, Hysterectomy, Hip Replacement,   Drug Allergies  Pencillin,    History & Physical: Heent: Cataract, Right Eye NECK: supple without bruits LUNGS: lungs clear to auscultation CV: regular rate and rhythm Abdomen: soft and non-tender  Impression & Plan: Assessment: 1.  CATARACT EXTRACTION STATUS; Left Eye (Z98.42) 2.  INTRAOCULAR LENS IOL ; Left Eye (Z96.1) 3.  NUCLEAR SCLEROSIS AGE RELATED; Right Eye (H25.11)  Plan: 1.  4 months after surgery - doing well, well healed, improved vision. Call with any worsening vision, pain, or any other concerns.  2.  Doing well since surgery As above.  3.  Cataract accounts for the patient's decreased vision. This visual impairment is not correctable with a tolerable change in glasses or contact lenses. Cataract surgery with an implantation of a new lens should significantly improve the visual and functional status of the patient. Discussed all risks, benefits, alternatives, and potential complications. Discussed the procedures and recovery. Patient desires to have surgery. A-scan ordered and performed today for intra-ocular lens calculations. The surgery will be performed in order to improve vision for driving, reading, and for eye examinations. Recommend phacoemulsification with intra-ocular lens. Recommend Dextenza for post-operative pain and inflammation. Right Eye. Surgery required to correct imbalance of vision. Dilates well - shugarcaine by protocol.

## 2021-12-27 ENCOUNTER — Ambulatory Visit (HOSPITAL_COMMUNITY): Payer: Medicare Other | Admitting: Anesthesiology

## 2021-12-27 ENCOUNTER — Ambulatory Visit (HOSPITAL_BASED_OUTPATIENT_CLINIC_OR_DEPARTMENT_OTHER): Payer: Medicare Other | Admitting: Anesthesiology

## 2021-12-27 ENCOUNTER — Ambulatory Visit (HOSPITAL_COMMUNITY)
Admission: RE | Admit: 2021-12-27 | Discharge: 2021-12-27 | Disposition: A | Discharge status: Home / Self Care | Payer: Medicare Other | Attending: Ophthalmology | Admitting: Ophthalmology

## 2021-12-27 ENCOUNTER — Encounter (HOSPITAL_COMMUNITY)
Admission: RE | Disposition: A | Discharge status: Home / Self Care | Payer: Self-pay | Source: Home / Self Care | Attending: Ophthalmology

## 2021-12-27 ENCOUNTER — Encounter (HOSPITAL_COMMUNITY): Payer: Self-pay | Admitting: Ophthalmology

## 2021-12-27 DIAGNOSIS — H25811 Combined forms of age-related cataract, right eye: Secondary | ICD-10-CM | POA: Insufficient documentation

## 2021-12-27 DIAGNOSIS — F17200 Nicotine dependence, unspecified, uncomplicated: Secondary | ICD-10-CM | POA: Insufficient documentation

## 2021-12-27 DIAGNOSIS — I10 Essential (primary) hypertension: Secondary | ICD-10-CM | POA: Insufficient documentation

## 2021-12-27 DIAGNOSIS — Z79899 Other long term (current) drug therapy: Secondary | ICD-10-CM | POA: Diagnosis not present

## 2021-12-27 DIAGNOSIS — H409 Unspecified glaucoma: Secondary | ICD-10-CM | POA: Diagnosis not present

## 2021-12-27 HISTORY — PX: CATARACT EXTRACTION W/PHACO: SHX586

## 2021-12-27 SURGERY — PHACOEMULSIFICATION, CATARACT, WITH IOL INSERTION
Anesthesia: Monitor Anesthesia Care | Site: Eye | Laterality: Right

## 2021-12-27 MED ORDER — STERILE WATER FOR IRRIGATION IR SOLN
Status: DC | PRN
Start: 2021-12-27 — End: 2021-12-29
  Administered 2021-12-27: 250 mL

## 2021-12-27 MED ORDER — MIDAZOLAM HCL 2 MG/2ML IJ SOLN
INTRAMUSCULAR | Status: AC
  Filled 2021-12-27: qty 2

## 2021-12-27 MED ORDER — NEOMYCIN-POLYMYXIN-DEXAMETH 3.5-10000-0.1 OP SUSP
OPHTHALMIC | Status: DC | PRN
  Administered 2021-12-27: 1 [drp] via OPHTHALMIC

## 2021-12-27 MED ORDER — SODIUM HYALURONATE 23MG/ML IO SOSY
PREFILLED_SYRINGE | INTRAOCULAR | Status: DC | PRN
  Administered 2021-12-27: 0.6 mL via INTRAOCULAR

## 2021-12-27 MED ORDER — POVIDONE-IODINE 5 % OP SOLN
OPHTHALMIC | Status: DC | PRN
  Administered 2021-12-27: 1 via OPHTHALMIC

## 2021-12-27 MED ORDER — PHENYLEPHRINE HCL 2.5 % OP SOLN
1.0000 [drp] | OPHTHALMIC | Status: DC | PRN
  Administered 2021-12-27 (×2): 1 [drp] via OPHTHALMIC

## 2021-12-27 MED ORDER — TROPICAMIDE 1 % OP SOLN
1.0000 [drp] | OPHTHALMIC | Status: DC | PRN
  Administered 2021-12-27 (×2): 1 [drp] via OPHTHALMIC
  Filled 2021-12-27: qty 2

## 2021-12-27 MED ORDER — EPINEPHRINE PF 1 MG/ML IJ SOLN
INTRAOCULAR | Status: DC | PRN
  Administered 2021-12-27: 500 mL

## 2021-12-27 MED ORDER — LIDOCAINE HCL 3.5 % OP GEL: 1.0000 "application " | Freq: Once | OPHTHALMIC | Status: DC

## 2021-12-27 MED ORDER — TETRACAINE HCL 0.5 % OP SOLN
1.0000 [drp] | OPHTHALMIC | Status: DC | PRN
  Administered 2021-12-27 (×2): 1 [drp] via OPHTHALMIC

## 2021-12-27 MED ORDER — EPINEPHRINE PF 1 MG/ML IJ SOLN
INTRAMUSCULAR | Status: AC
  Filled 2021-12-27: qty 2

## 2021-12-27 MED ORDER — BSS IO SOLN
INTRAOCULAR | Status: DC | PRN
  Administered 2021-12-27: 15 mL via INTRAOCULAR

## 2021-12-27 MED ORDER — SODIUM HYALURONATE 10 MG/ML IO SOLUTION
PREFILLED_SYRINGE | INTRAOCULAR | Status: DC | PRN
  Administered 2021-12-27: 0.85 mL via INTRAOCULAR

## 2021-12-27 MED ORDER — LIDOCAINE HCL (PF) 1 % IJ SOLN
INTRAMUSCULAR | Status: DC | PRN
  Administered 2021-12-27: 1 mL via OPHTHALMIC

## 2021-12-27 SURGICAL SUPPLY — 16 items
CATARACT SUITE SIGHTPATH (MISCELLANEOUS) ×2 IMPLANT
CLOTH BEACON ORANGE TIMEOUT ST (SAFETY) ×2 IMPLANT
EYE SHIELD UNIVERSAL CLEAR (GAUZE/BANDAGES/DRESSINGS) ×1 IMPLANT
FEE CATARACT SUITE SIGHTPATH (MISCELLANEOUS) ×1 IMPLANT
GLOVE SURG UNDER POLY LF SZ6.5 (GLOVE) ×1 IMPLANT
GLOVE SURG UNDER POLY LF SZ7 (GLOVE) ×1 IMPLANT
LENS IOL DIOP 17.5 (Intraocular Lens) ×2 IMPLANT
LENS IOL TECNIS MONO 17.5 (Intraocular Lens) IMPLANT
NDL HYPO 18GX1.5 BLUNT FILL (NEEDLE) ×1 IMPLANT
NEEDLE HYPO 18GX1.5 BLUNT FILL (NEEDLE) ×2 IMPLANT
PAD ARMBOARD 7.5X6 YLW CONV (MISCELLANEOUS) ×2 IMPLANT
RING MALYGIN 7.0 (MISCELLANEOUS) IMPLANT
SYR TB 1ML LL NO SAFETY (SYRINGE) ×2 IMPLANT
TAPE SURG TRANSPORE 1 IN (GAUZE/BANDAGES/DRESSINGS) IMPLANT
TAPE SURGICAL TRANSPORE 1 IN (GAUZE/BANDAGES/DRESSINGS) ×1
WATER STERILE IRR 250ML POUR (IV SOLUTION) ×2 IMPLANT

## 2021-12-27 NOTE — Transfer of Care (Signed)
Immediate Anesthesia Transfer of Care Note  Patient: Tanya Hammond  Procedure(s) Performed: CATARACT EXTRACTION PHACO AND INTRAOCULAR LENS PLACEMENT (IOC) (Right: Eye)  Patient Location: Short Stay  Anesthesia Type:MAC  Level of Consciousness: awake, alert  and oriented  Airway & Oxygen Therapy: Patient Spontanous Breathing  Post-op Assessment: Report given to RN and Post -op Vital signs reviewed and stable  Post vital signs: Reviewed and stable  Last Vitals:  Vitals Value Taken Time  BP    Temp    Pulse    Resp    SpO2      Last Pain: There were no vitals filed for this visit.       Complications: No notable events documented.

## 2021-12-27 NOTE — Discharge Instructions (Signed)
Please discharge patient when stable, will follow up today with Dr. Cecil Bixby at the [facility] [place] office immediately following discharge.  Leave shield in place until visit.  All paperwork with discharge instructions will be given at the office.  [facility] Address:  [address]  [place], [place] [address]  

## 2021-12-27 NOTE — Anesthesia Postprocedure Evaluation (Signed)
Anesthesia Post Note  Patient: Tanya Hammond  Procedure(s) Performed: CATARACT EXTRACTION PHACO AND INTRAOCULAR LENS PLACEMENT (IOC) (Right: Eye)  Patient location during evaluation: Phase II Anesthesia Type: MAC Level of consciousness: awake and alert and oriented Pain management: pain level controlled Vital Signs Assessment: post-procedure vital signs reviewed and stable Respiratory status: spontaneous breathing, nonlabored ventilation and respiratory function stable Cardiovascular status: stable and blood pressure returned to baseline Postop Assessment: no apparent nausea or vomiting Anesthetic complications: no   No notable events documented.   Last Vitals:  Vitals:   12/27/21 0820  BP: (!) 147/67  Pulse: 66  Resp: 18  Temp: 36.6 C  SpO2: 97%    Last Pain:  Vitals:   12/27/21 0820  TempSrc: Oral  PainSc: 0-No pain                 [name]

## 2021-12-27 NOTE — Anesthesia Preprocedure Evaluation (Signed)
Anesthesia Evaluation  Patient identified by MRN, date of birth, ID band Patient awake    Reviewed: Allergy & Precautions, NPO status , Patient's Chart, lab work & pertinent test results  Airway Mallampati: II  TM Distance: >3 FB Neck ROM: Full    Dental  (+) Dental Advisory Given, Missing   Pulmonary Current Smoker,    Pulmonary exam normal breath sounds clear to auscultation       Cardiovascular Exercise Tolerance: Good hypertension, Pt. on medications Normal cardiovascular exam Rhythm:Regular Rate:Normal     Neuro/Psych negative neurological ROS  negative psych ROS   GI/Hepatic negative GI ROS, Neg liver ROS,   Endo/Other  negative endocrine ROS  Renal/GU negative Renal ROS  negative genitourinary   Musculoskeletal negative musculoskeletal ROS (+)   Abdominal   Peds negative pediatric ROS (+)  Hematology negative hematology ROS (+)   Anesthesia Other Findings   Reproductive/Obstetrics negative OB ROS                             Anesthesia Physical Anesthesia Plan  ASA: 2  Anesthesia Plan: MAC   Post-op Pain Management: Minimal or no pain anticipated   Induction:   PONV Risk Score and Plan: Treatment may vary due to age or medical condition  Airway Management Planned: Nasal Cannula and Natural Airway  Additional Equipment:   Intra-op Plan:   Post-operative Plan:   Informed Consent: I have reviewed the patients History and Physical, chart, labs and discussed the procedure including the risks, benefits and alternatives for the proposed anesthesia with the patient or authorized representative who has indicated his/her understanding and acceptance.     Dental advisory given  Plan Discussed with: CRNA and Surgeon  Anesthesia Plan Comments:         Anesthesia Quick Evaluation

## 2021-12-27 NOTE — Op Note (Signed)
Date of procedure: 12/27/21  Pre-operative diagnosis:  Visually significant combined form age-related cataract, Right Eye (H25.811)  Post-operative diagnosis:  Visually significant combined form age-related cataract, Right Eye (H25.811)  Procedure: Removal of cataract via phacoemulsification and insertion of intra-ocular lens Wynetta Emery and Johnson DCB00 +17.5D into the capsular bag of the Right Eye  Attending surgeon: Gerda Diss. Jing Howatt, MD, MA  Anesthesia: MAC, Topical Akten  Complications: None  Estimated Blood Loss: <62m (minimal)  Specimens: None  Implants: As above  Indications:  Visually significant age-related cataract, Right Eye  Procedure:  The patient was seen and identified in the pre-operative area. The operative eye was identified and dilated.  The operative eye was marked.  Topical anesthesia was administered to the operative eye.     The patient was then to the operative suite and placed in the supine position.  A timeout was performed confirming the patient, procedure to be performed, and all other relevant information.   The patient's face was prepped and draped in the usual fashion for intra-ocular surgery.  A lid speculum was placed into the operative eye and the surgical microscope moved into place and focused.  A superotemporal paracentesis was created using a 20 gauge paracentesis blade.  Shugarcaine was injected into the anterior chamber.  Viscoelastic was injected into the anterior chamber.  A temporal clear-corneal main wound incision was created using a 2.442mmicrokeratome.  A continuous curvilinear capsulorrhexis was initiated using an irrigating cystitome and completed using capsulorrhexis forceps.  Hydrodissection and hydrodeliniation were performed.  Viscoelastic was injected into the anterior chamber.  A phacoemulsification handpiece and a chopper as a second instrument were used to remove the nucleus and epinucleus. The irrigation/aspiration handpiece was used to  remove any remaining cortical material.   The capsular bag was reinflated with viscoelastic, checked, and found to be intact.  The intraocular lens was inserted into the capsular bag.  The irrigation/aspiration handpiece was used to remove any remaining viscoelastic.  The clear corneal wound and paracentesis wounds were then hydrated and checked with Weck-Cels to be watertight.  The lid-speculum was removed.  The drape was removed.  The patient's face was cleaned with a wet and dry 4x4.   Maxitrol was instilled in the eye. A clear shield was taped over the eye. The patient was taken to the post-operative care unit in good condition, having tolerated the procedure well.  Post-Op Instructions: [facility] and will receive all other orders and instructions.

## 2021-12-27 NOTE — Anesthesia Procedure Notes (Signed)
Procedure Name: MAC Date/Time: 12/27/2021 7:58 AM Performed by: Orlie Dakin, CRNA Pre-anesthesia Checklist: Patient identified, Emergency Drugs available, Suction available and Patient being monitored Patient Re-evaluated:Patient Re-evaluated prior to induction Oxygen Delivery Method: Nasal cannula Placement Confirmation: positive ETCO2

## 2021-12-27 NOTE — Interval H&P Note (Signed)
History and Physical Interval Note:  12/27/2021 7:50 AM  Tanya Hammond  has presented today for surgery, with the diagnosis of nuclear sclerosis age related cataract; right.  The various methods of treatment have been discussed with the patient and family. After consideration of risks, benefits and other options for treatment, the patient has consented to  Procedure(s) with comments: CATARACT EXTRACTION PHACO AND INTRAOCULAR LENS PLACEMENT (IOC) (Right) - right as a surgical intervention.  The patient's history has been reviewed, patient examined, no change in status, stable for surgery.  I have reviewed the patient's chart and labs.  Questions were answered to the patient's satisfaction.     Tanya Hammond

## 2021-12-29 ENCOUNTER — Encounter (HOSPITAL_COMMUNITY): Payer: Self-pay | Admitting: Ophthalmology

## 2022-01-17 ENCOUNTER — Other Ambulatory Visit (HOSPITAL_COMMUNITY): Payer: Self-pay | Admitting: Family Medicine

## 2022-01-17 DIAGNOSIS — M25512 Pain in left shoulder: Secondary | ICD-10-CM | POA: Diagnosis not present

## 2022-01-26 ENCOUNTER — Other Ambulatory Visit: Payer: Self-pay

## 2022-01-26 ENCOUNTER — Ambulatory Visit (HOSPITAL_COMMUNITY)
Admission: RE | Admit: 2022-01-26 | Discharge: 2022-01-26 | Disposition: A | Discharge status: Home / Self Care | Payer: Medicare Other | Source: Ambulatory Visit | Attending: Family Medicine | Admitting: Family Medicine

## 2022-01-26 DIAGNOSIS — M25512 Pain in left shoulder: Secondary | ICD-10-CM

## 2022-01-26 DIAGNOSIS — Z1231 Encounter for screening mammogram for malignant neoplasm of breast: Secondary | ICD-10-CM | POA: Insufficient documentation

## 2022-05-25 DIAGNOSIS — E559 Vitamin D deficiency, unspecified: Secondary | ICD-10-CM | POA: Diagnosis not present

## 2022-05-25 DIAGNOSIS — Z136 Encounter for screening for cardiovascular disorders: Secondary | ICD-10-CM | POA: Diagnosis not present

## 2022-05-25 DIAGNOSIS — R7303 Prediabetes: Secondary | ICD-10-CM | POA: Diagnosis not present

## 2022-06-01 ENCOUNTER — Other Ambulatory Visit (HOSPITAL_COMMUNITY): Payer: Self-pay | Admitting: Family Medicine

## 2022-06-01 DIAGNOSIS — I129 Hypertensive chronic kidney disease with stage 1 through stage 4 chronic kidney disease, or unspecified chronic kidney disease: Secondary | ICD-10-CM | POA: Diagnosis not present

## 2022-06-01 DIAGNOSIS — J302 Other seasonal allergic rhinitis: Secondary | ICD-10-CM | POA: Diagnosis not present

## 2022-06-01 DIAGNOSIS — N1831 Chronic kidney disease, stage 3a: Secondary | ICD-10-CM | POA: Diagnosis not present

## 2022-06-01 DIAGNOSIS — Z13820 Encounter for screening for osteoporosis: Secondary | ICD-10-CM

## 2022-06-01 DIAGNOSIS — F17200 Nicotine dependence, unspecified, uncomplicated: Secondary | ICD-10-CM | POA: Diagnosis not present

## 2022-06-01 DIAGNOSIS — Z1211 Encounter for screening for malignant neoplasm of colon: Secondary | ICD-10-CM | POA: Diagnosis not present

## 2022-06-01 DIAGNOSIS — F5101 Primary insomnia: Secondary | ICD-10-CM | POA: Diagnosis not present

## 2022-06-01 DIAGNOSIS — E782 Mixed hyperlipidemia: Secondary | ICD-10-CM | POA: Diagnosis not present

## 2022-06-01 DIAGNOSIS — R7301 Impaired fasting glucose: Secondary | ICD-10-CM | POA: Diagnosis not present

## 2022-06-01 DIAGNOSIS — R079 Chest pain, unspecified: Secondary | ICD-10-CM | POA: Diagnosis not present

## 2022-06-07 ENCOUNTER — Other Ambulatory Visit (HOSPITAL_COMMUNITY): Payer: Medicare Other

## 2022-06-15 ENCOUNTER — Ambulatory Visit (HOSPITAL_COMMUNITY)
Admission: RE | Admit: 2022-06-15 | Discharge: 2022-06-15 | Disposition: A | Discharge status: Home / Self Care | Payer: Medicare Other | Source: Ambulatory Visit | Attending: Family Medicine | Admitting: Family Medicine

## 2022-06-15 DIAGNOSIS — M8589 Other specified disorders of bone density and structure, multiple sites: Secondary | ICD-10-CM | POA: Insufficient documentation

## 2022-06-15 DIAGNOSIS — E559 Vitamin D deficiency, unspecified: Secondary | ICD-10-CM | POA: Insufficient documentation

## 2022-06-15 DIAGNOSIS — F17200 Nicotine dependence, unspecified, uncomplicated: Secondary | ICD-10-CM | POA: Diagnosis not present

## 2022-06-15 DIAGNOSIS — Z13820 Encounter for screening for osteoporosis: Secondary | ICD-10-CM | POA: Diagnosis not present

## 2022-06-15 DIAGNOSIS — Z780 Asymptomatic menopausal state: Secondary | ICD-10-CM | POA: Diagnosis not present

## 2022-06-15 DIAGNOSIS — M8588 Other specified disorders of bone density and structure, other site: Secondary | ICD-10-CM | POA: Diagnosis not present

## 2022-07-05 ENCOUNTER — Ambulatory Visit (INDEPENDENT_AMBULATORY_CARE_PROVIDER_SITE_OTHER): Payer: Medicare Other | Admitting: Orthopedic Surgery

## 2022-07-05 ENCOUNTER — Encounter: Payer: Self-pay | Admitting: Orthopedic Surgery

## 2022-07-05 ENCOUNTER — Ambulatory Visit (INDEPENDENT_AMBULATORY_CARE_PROVIDER_SITE_OTHER): Payer: Medicare Other

## 2022-07-05 VITALS — BP 136/58 | HR 84 | Ht 68.0 in | Wt 175.0 lb

## 2022-07-05 DIAGNOSIS — G8929 Other chronic pain: Secondary | ICD-10-CM | POA: Diagnosis not present

## 2022-07-05 DIAGNOSIS — M25512 Pain in left shoulder: Secondary | ICD-10-CM | POA: Diagnosis not present

## 2022-07-05 NOTE — Progress Notes (Signed)
New Patient Visit  Assessment: Tanya Hammond is a 76 y.o. female with the following: 1. Chronic left shoulder pain  Plan: YAZMEEN WOOLF has pain in her left shoulder.  She fell approximately months ago.  It has progressively worsened.  However, at this point, and continues to improve.  She does note decreased range of motion, but this is not limiting her activities.  She may have an injury to the rotator cuff, as well as the long head of the biceps tendon.  This was discussed with her.  We reviewed options, including medications, injection and therapy.  She is comfortable continuing with medications as needed, and exercises at home.  She declined an injection.  I informed her that she can return if the pain worsens, or fails to continue to get better.  She states her understanding.  Follow-up as needed.  Follow-up: Return if symptoms worsen or fail to improve.  Subjective:  Chief Complaint  Patient presents with   Arm Pain    Left biceps painful states has pain with motion of shoulder decreased ROM     History of Present Illness: AARIEL EMS is a 76 y.o. female who has been referred by Catalina Pizza, MD for evaluation of left shoulder pain.  She states she has had pain in the left shoulder for about 8 months.  She does report that she fell, but did not note immediate pain in the shoulder.  She does continue to have pain, gets worse at night.  Pain is primarily in the anterior aspect of the left shoulder.  She has some decreased range of motion.  However, she notes that overall, her pain is getting better.  She takes medications as needed, including ibuprofen.  She continues with her usual activities.  She has not had an injection.  She has not worked with physical therapy.   Review of Systems: No fevers or chills No numbness or tingling No chest pain No shortness of breath No bowel or bladder dysfunction No GI distress No headaches   Medical History:  Past Medical History:   Diagnosis Date   Hypertension    Multiple allergies     Past Surgical History:  Procedure Laterality Date   APPENDECTOMY     CATARACT EXTRACTION W/PHACO Left 05/28/2021   Procedure: CATARACT EXTRACTION PHACO AND INTRAOCULAR LENS PLACEMENT (IOC);  Surgeon: [name], MD;  Location: [facility];  Service: Ophthalmology;  Laterality: Left;  CDE  16.74   CATARACT EXTRACTION W/PHACO Right 12/27/2021   Procedure: CATARACT EXTRACTION PHACO AND INTRAOCULAR LENS PLACEMENT (IOC);  Surgeon: [name], MD;  Location: [facility];  Service: Ophthalmology;  Laterality: Right;  CDE: 13.88   GALLBLADDER SURGERY     ortho procedures     PARTIAL HIP ARTHROPLASTY     bi-lat   TOTAL ABDOMINAL HYSTERECTOMY      Family History  Problem Relation Age of Onset   Diabetes Mother    Hypertension Mother    Alzheimer's disease Mother    Lung cancer Father    Social History   Tobacco Use   Smoking status: Every Day    Packs/day: 0.05    Types: Cigarettes    Start date: 01/21/1967   Smokeless tobacco: Never  Substance Use Topics   Alcohol use: Yes   Drug use: No    Allergies  Allergen Reactions   Penicillins Hives and Rash    Has patient had a PCN reaction causing immediate rash, facial/tongue/throat swelling, SOB or lightheadedness with  hypotension: yes Has patient had a PCN reaction causing severe rash involving mucus membranes or skin necrosis: yes Has patient had a PCN reaction that required hospitalization: no Has patient had a PCN reaction occurring within the last 10 years: no If all of the above answers are "NO", then may proceed with Cephalosporin use.     Current Meds  Medication Sig   alendronate (FOSAMAX) 70 MG tablet Take by mouth.   ALPRAZolam (XANAX) 0.25 MG tablet Take 0.25-0.5 mg by mouth at bedtime.   amitriptyline (ELAVIL) 25 MG tablet Take 25 mg by mouth at bedtime.     cetirizine (ZYRTEC) 10 MG tablet Take 10 mg by mouth daily.   chlorhexidine (PERIDEX) 0.12 % solution  Use as directed 15 mLs in the mouth or throat at bedtime.   escitalopram (LEXAPRO) 10 MG tablet Take 10 mg by mouth daily with breakfast.   ibuprofen (ADVIL,MOTRIN) 200 MG tablet Take 600 mg by mouth every 6 (six) hours as needed for moderate pain or headache.   losartan (COZAAR) 50 MG tablet Take 50 mg by mouth at bedtime.   Menthol, Topical Analgesic, (ICY HOT EX) Apply 1 application topically daily as needed (sciatic nerve pain).   methocarbamol (ROBAXIN) 500 MG tablet Take 500 mg by mouth 3 (three) times daily as needed (sciatic nerve pain).   metoprolol tartrate (LOPRESSOR) 25 MG tablet Take 25 mg by mouth 2 (two) times daily as needed (palpitations).   montelukast (SINGULAIR) 10 MG tablet Take 10 mg by mouth at bedtime.     PROAIR HFA 108 (90 Base) MCG/ACT inhaler Inhale 1-2 puffs into the lungs every 4 (four) hours as needed for shortness of breath.   varenicline (CHANTIX PAK) 0.5 MG X 11 & 1 MG X 42 tablet Take by mouth as directed.   vitamin B-12 (CYANOCOBALAMIN) 1000 MCG tablet Take 1,000 mcg by mouth 3 (three) times a week.   Vitamin D, Ergocalciferol, (DRISDOL) 1.25 MG (50000 UNIT) CAPS capsule Take 50,000 Units by mouth every Monday.    Objective: BP (!) 136/58   Pulse 84   Ht 5\' 8"  (1.727 m)   Wt 175 lb (79.4 kg)   BMI 26.61 kg/m   Physical Exam:  General: Elderly female., Alert and oriented., and No acute distress. Gait: Normal gait.  Evaluation of left shoulder demonstrates no deformity.  No swelling is appreciated.  No bruising.  Forward flexion is limited to 130 degrees.  Internal rotation to the side of the hip.  She has some pain with passive abduction.  Positive O'Brien's.  Pain in the empty can testing position.  Mild weakness is appreciated in the supraspinatus.  Negative belly press.  Fingers are warm and well-perfused.  IMAGING: I personally ordered and reviewed the following images  X-rays of the left shoulder were obtained in clinic today.  No acute injuries  are noted.  Glenohumeral joint is reduced.  Minimal degenerative changes within the [facility] joint.  No evidence of proximal humeral migration.  Impression: Negative left shoulder x-ray   New Medications:  No orders of the defined types were placed in this encounter.     [facility], MD  07/05/2022 11:31 AM

## 2022-07-05 NOTE — Patient Instructions (Signed)
Activities as tolerated  Medicines as tolerated  If your pain gets worse, or stops improving, please call the clinic for consideration for an injection.

## 2022-08-12 DIAGNOSIS — Z0000 Encounter for general adult medical examination without abnormal findings: Secondary | ICD-10-CM | POA: Diagnosis not present

## 2022-08-30 DIAGNOSIS — I10 Essential (primary) hypertension: Secondary | ICD-10-CM | POA: Diagnosis not present

## 2022-08-30 DIAGNOSIS — L309 Dermatitis, unspecified: Secondary | ICD-10-CM | POA: Diagnosis not present

## 2022-09-03 IMAGING — MG MM DIGITAL SCREENING BILAT W/ TOMO AND CAD
6 of 10 series · 6 of 30 positions shown · non-contrast
Comparison: Previous exam(s).

CLINICAL DATA: Screening.

EXAM:
DIGITAL SCREENING BILATERAL MAMMOGRAM WITH TOMOSYNTHESIS AND CAD
TECHNIQUE: Bilateral screening digital craniocaudal and mediolateral oblique
mammograms were obtained. Bilateral screening digital breast
tomosynthesis was performed. The images were evaluated with
computer-aided detection.

[L MLO synth-2D]
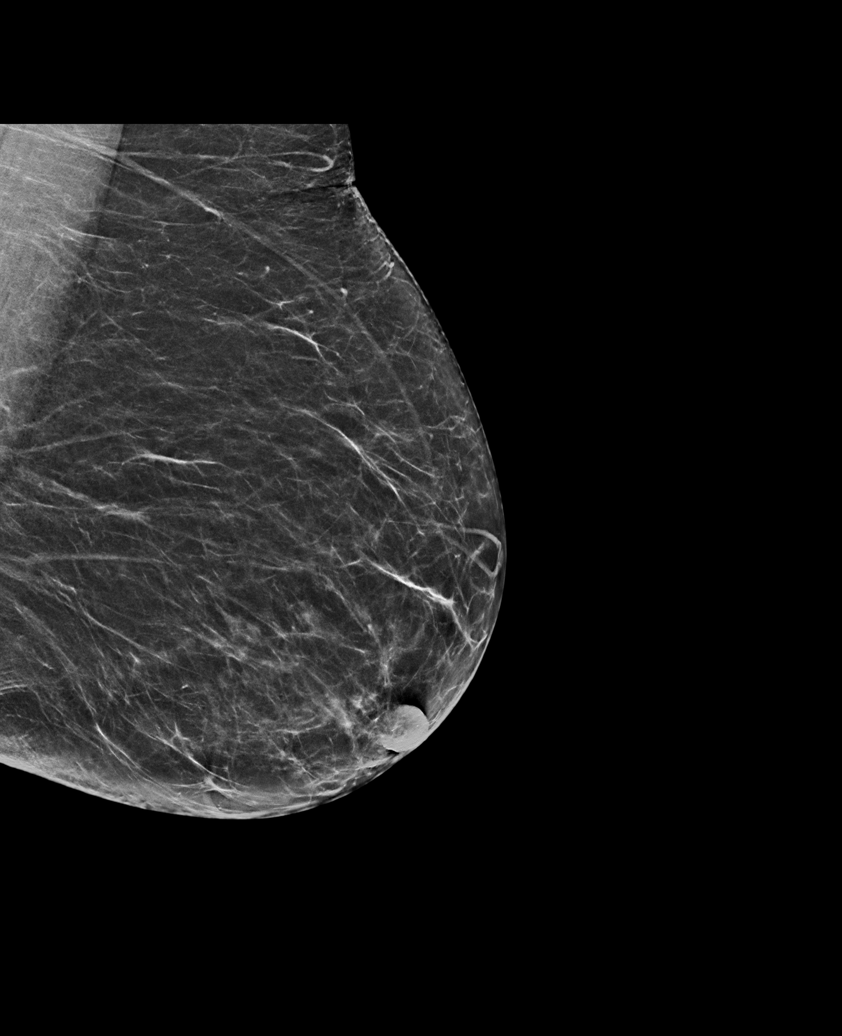

[R MLO synth-2D (1 of 2)]
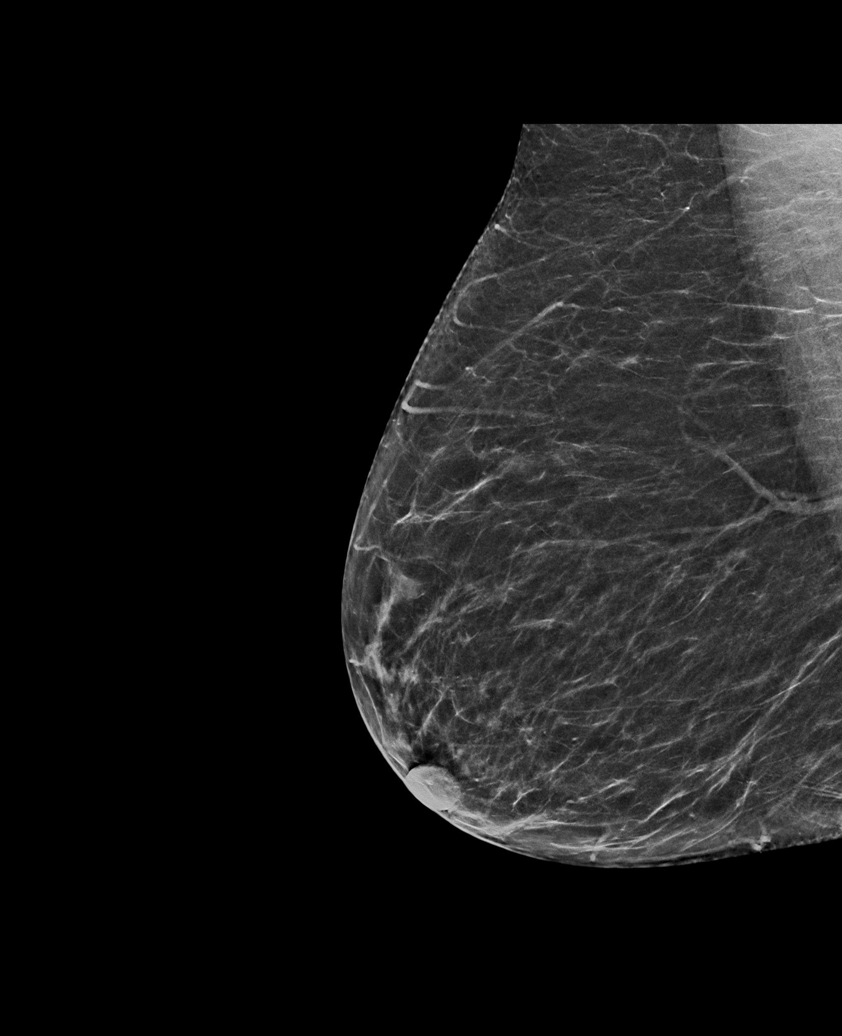

[L CC synth-2D]
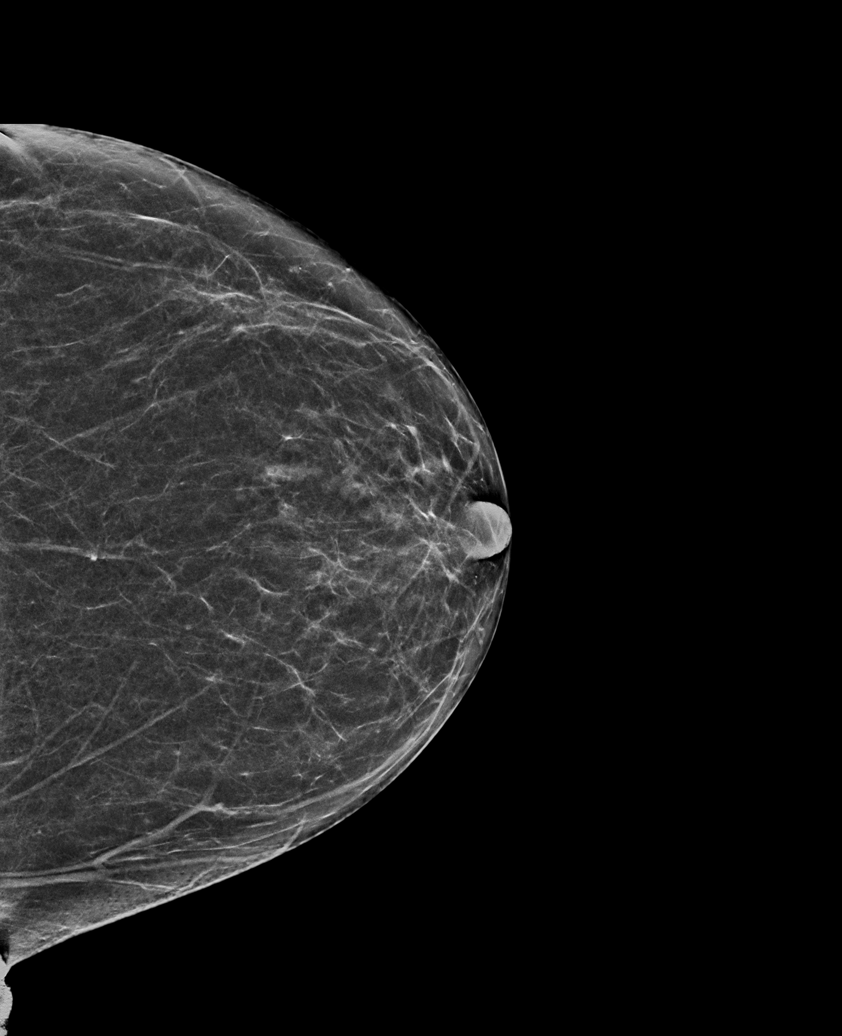

[R CC synth-2D]
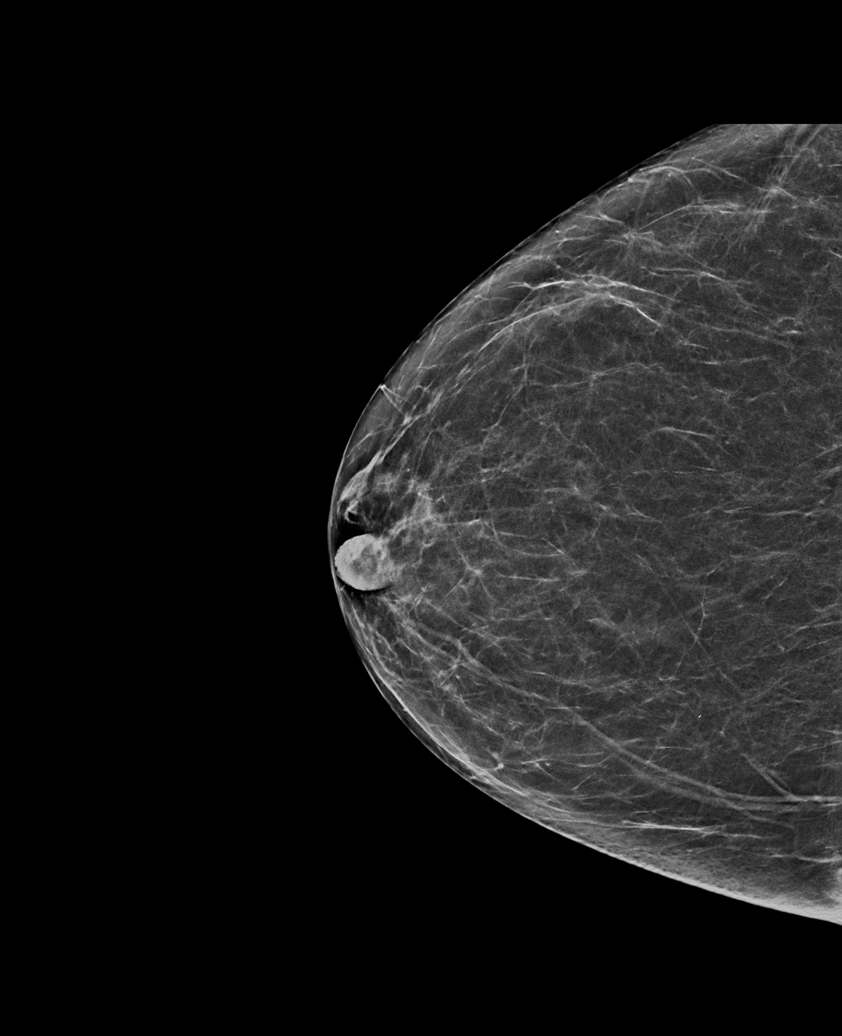

[R MLO synth-2D (2 of 2)]
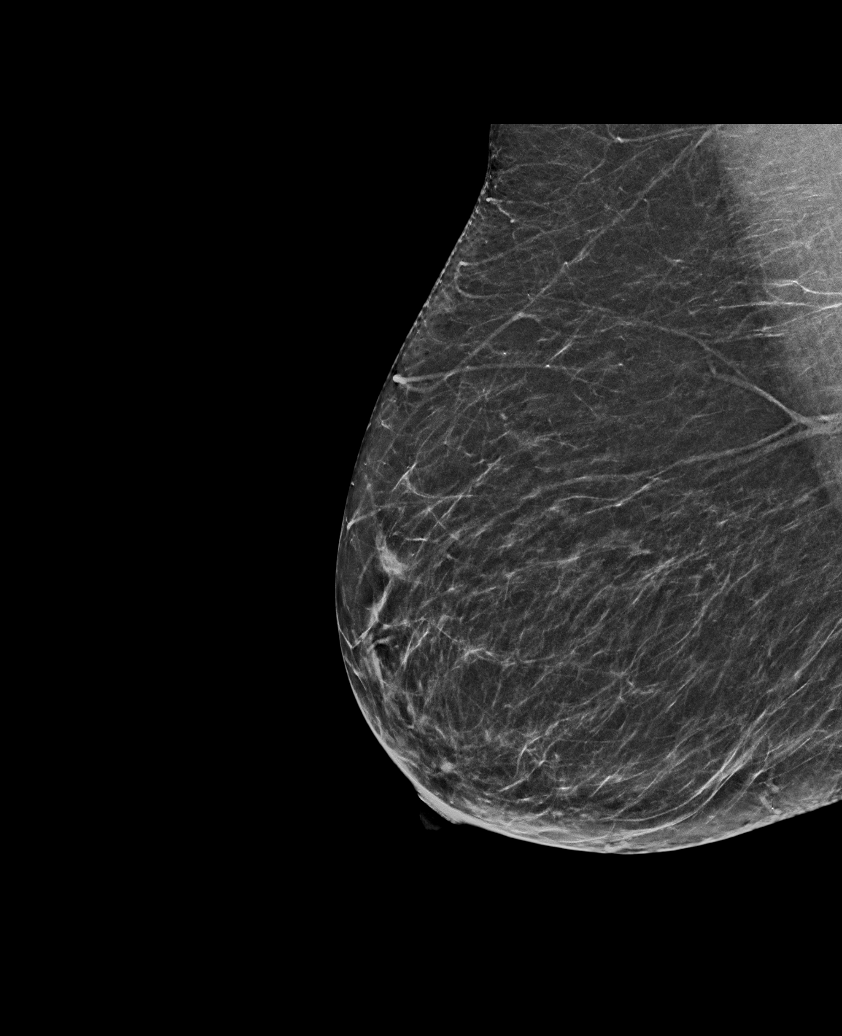

[L CC tomo · tomo slice 29/58.0]
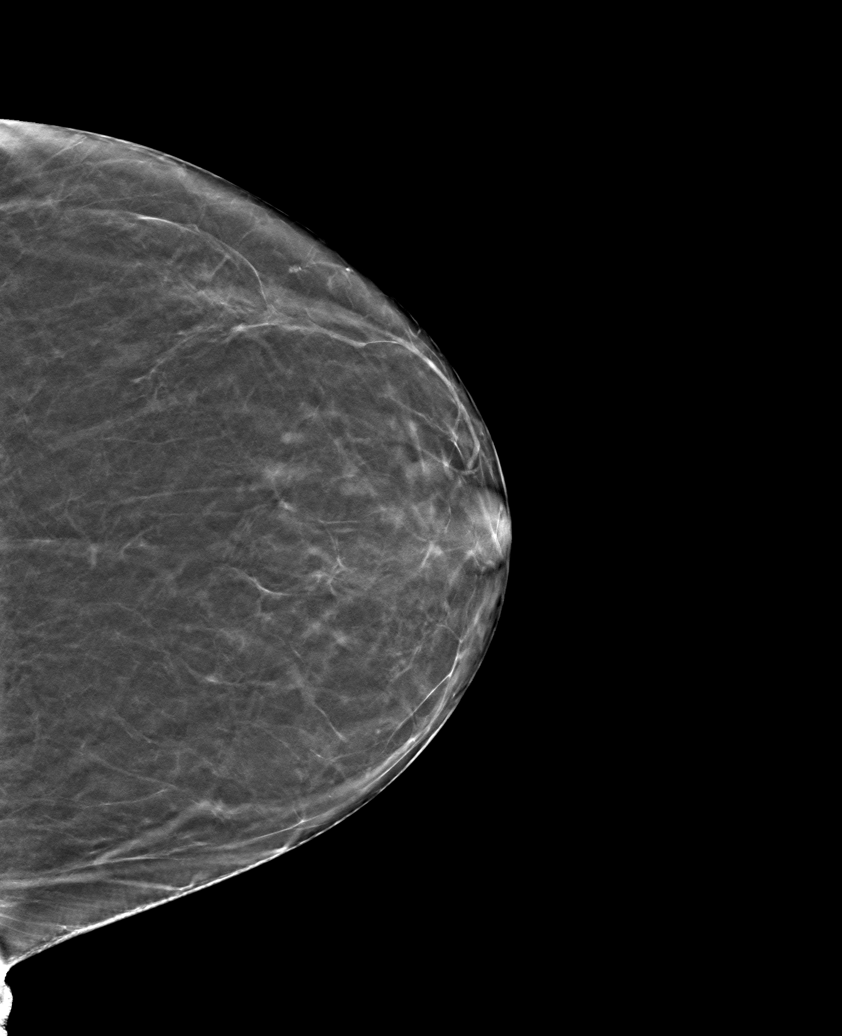

[6 of 30 positions shown; findings below may reference images not displayed]

ACR Breast Density Category b: There are scattered areas of
fibroglandular density.
FINDINGS: There are no findings suspicious for malignancy.
IMPRESSION: No mammographic evidence of malignancy. A result letter of this
screening mammogram will be mailed directly to the patient.

RECOMMENDATION:
Screening mammogram in one year. (Code:51-O-LD2)

BI-RADS CATEGORY  1: Negative.

## 2022-09-03 IMAGING — DX DG SHOULDER 2+V*L*
3 series · 3 of 3 positions shown · non-contrast
Comparison: None.

CLINICAL DATA: Left shoulder pain for 2 weeks.

EXAM:
LEFT SHOULDER - 2+ VIEW

[shoulder grashey]
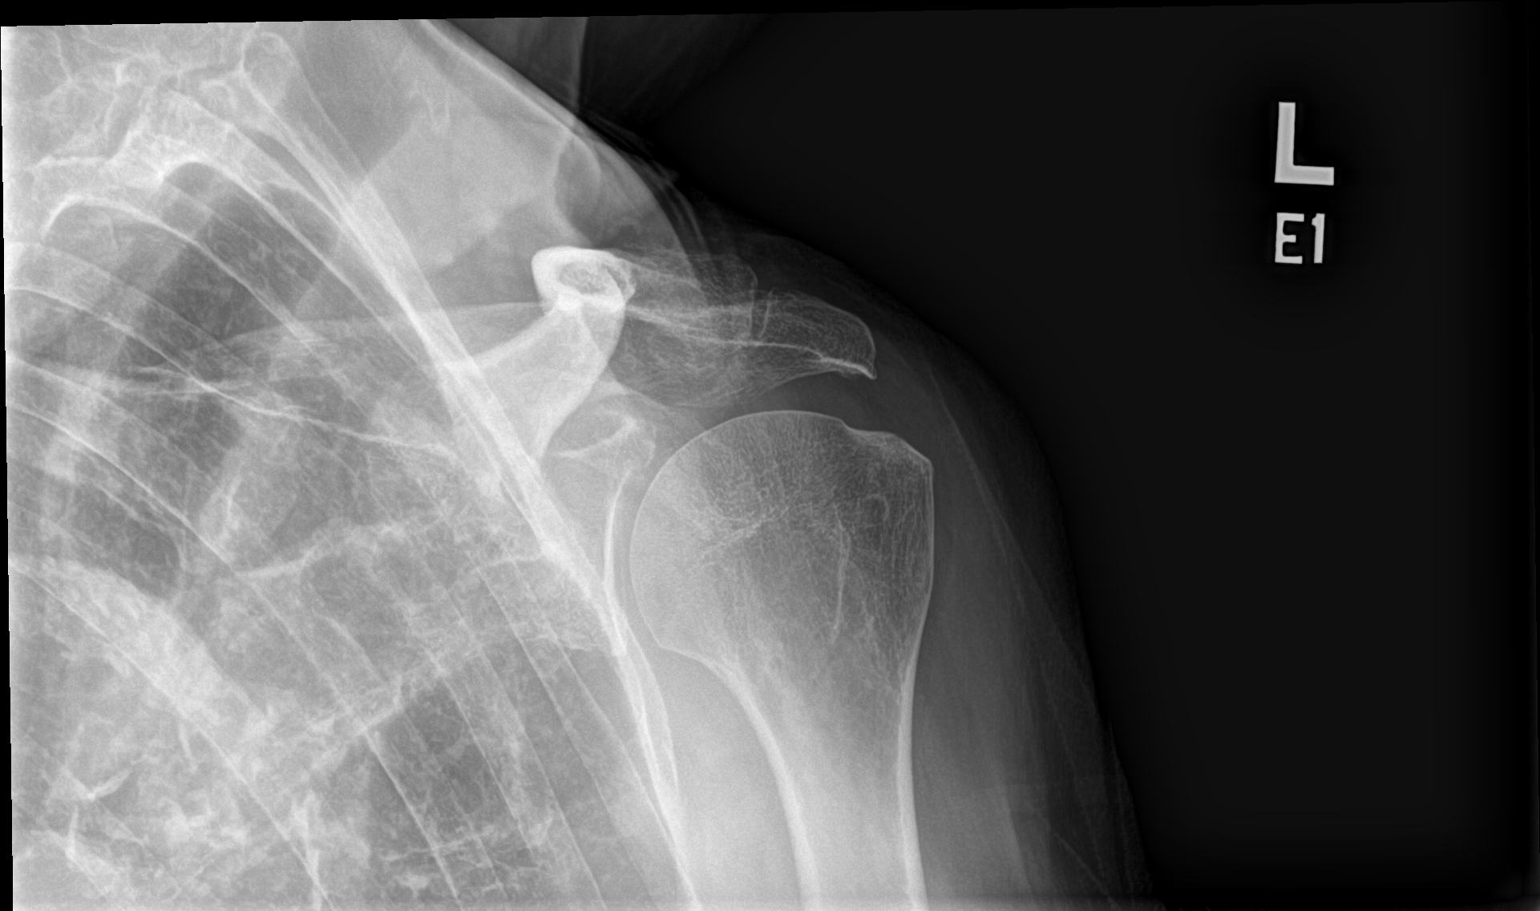

[shoulder y view]
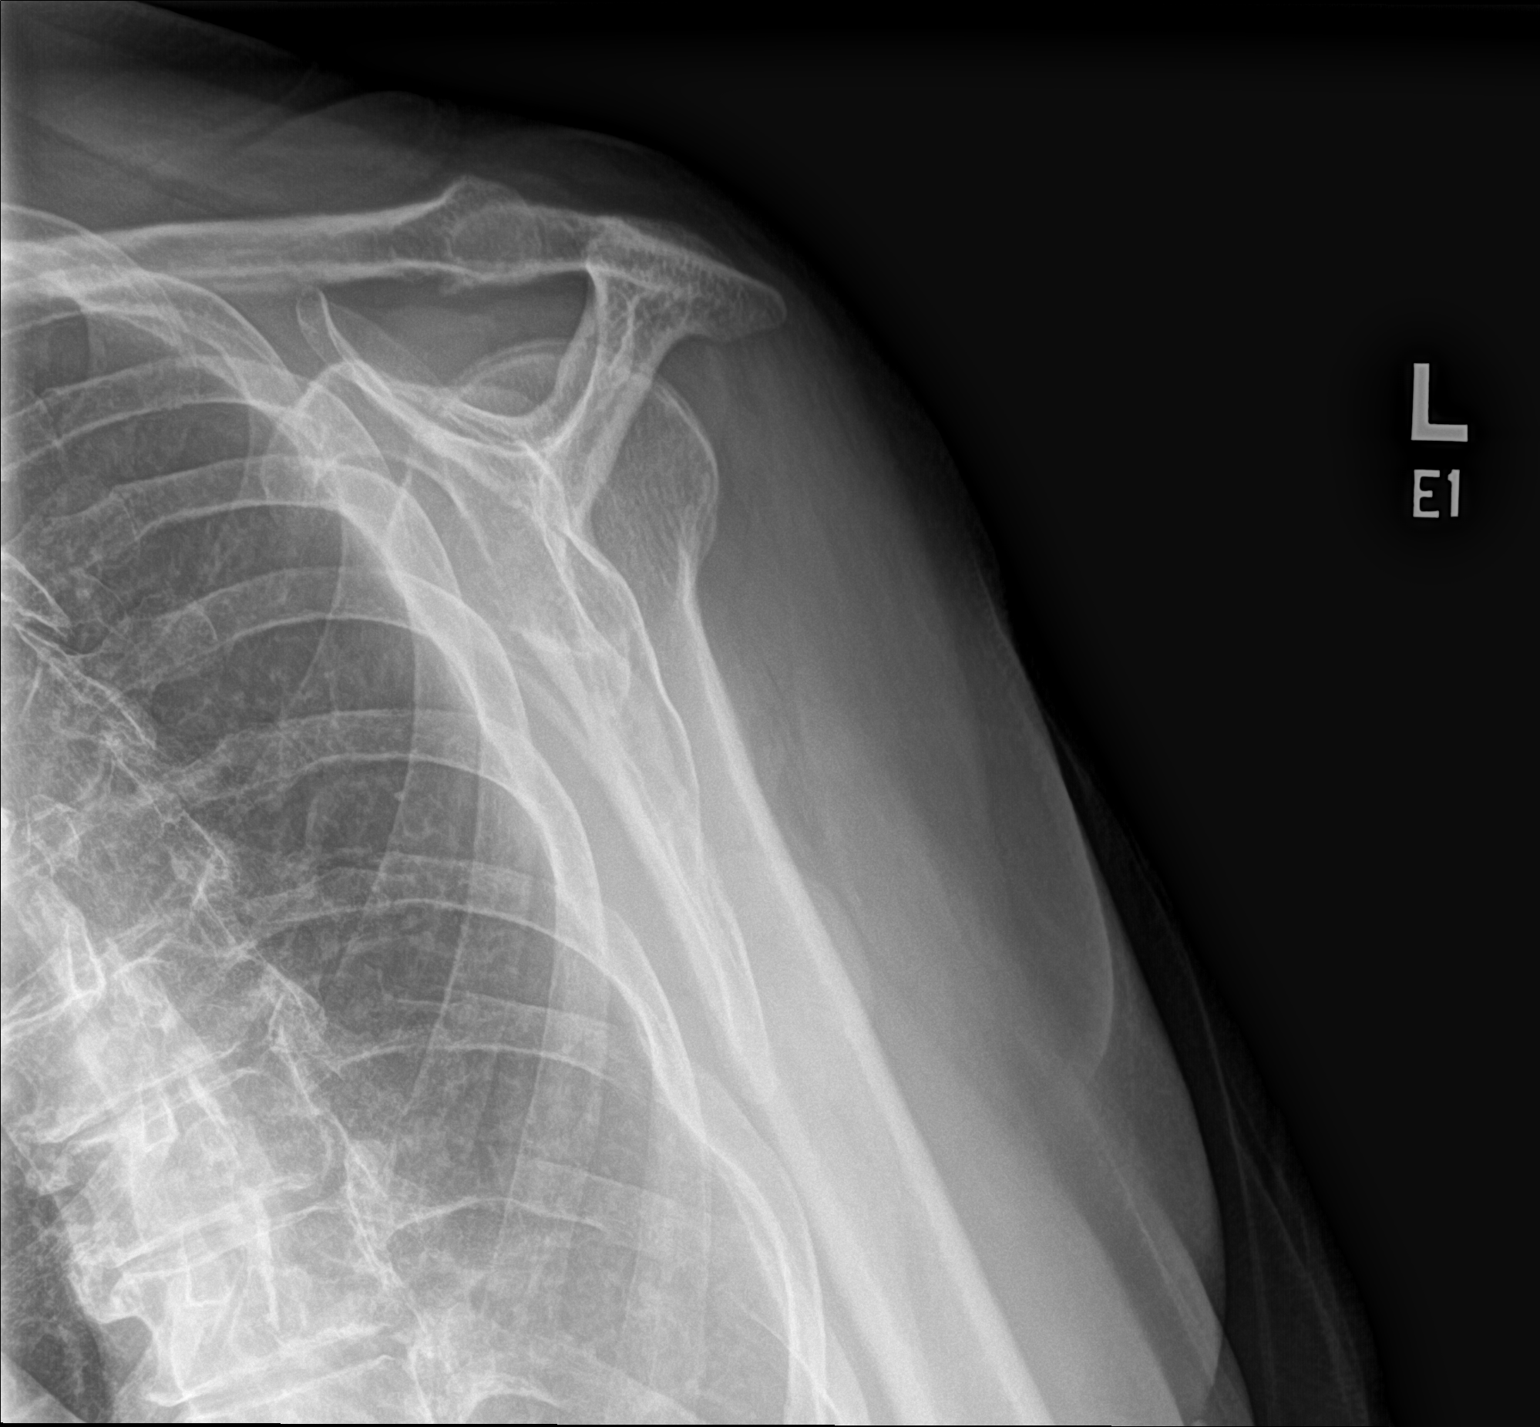

[shoulder axillary]
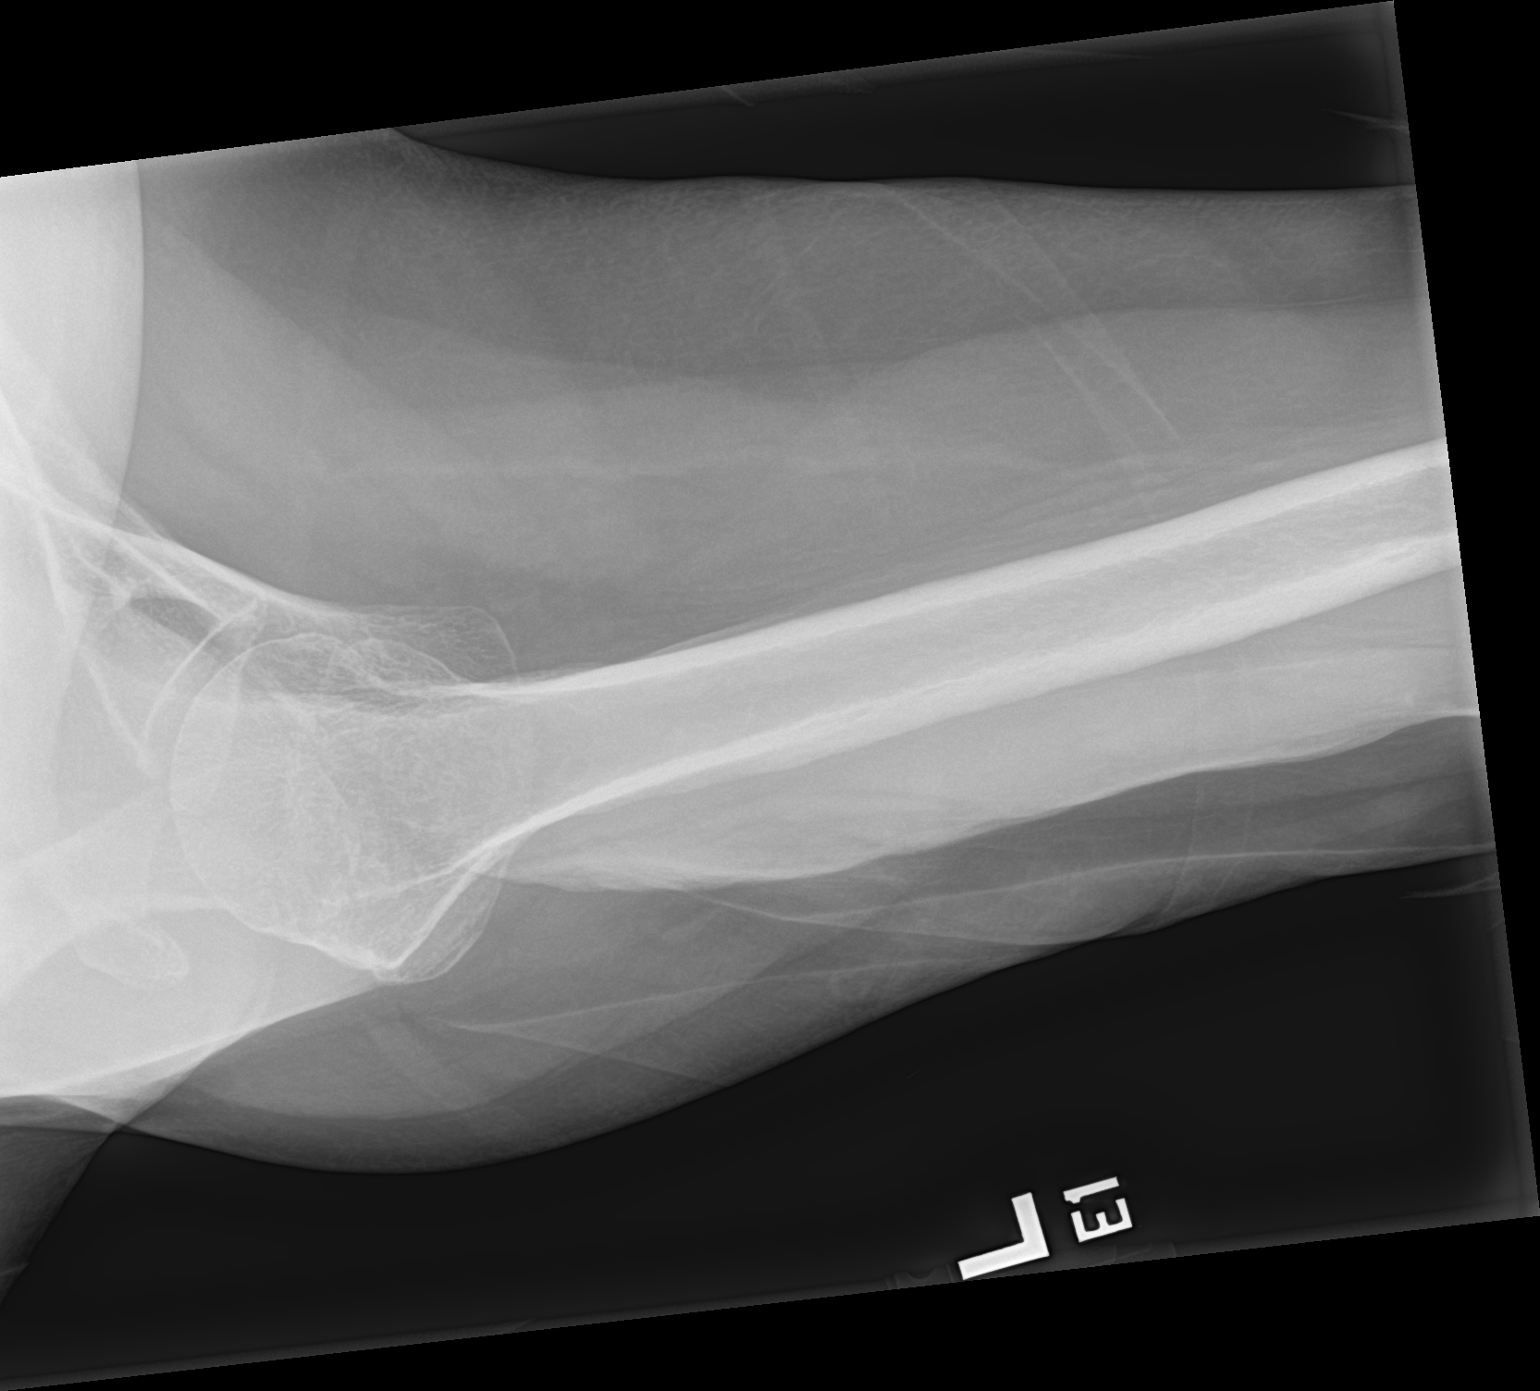

[3 of 3 positions shown; findings below may reference images not displayed]

FINDINGS: There is no evidence of fracture or dislocation. There is no
evidence of arthropathy or other focal bone abnormality. Soft
tissues are unremarkable.
IMPRESSION: Negative.

## 2022-11-25 DIAGNOSIS — N1831 Chronic kidney disease, stage 3a: Secondary | ICD-10-CM | POA: Diagnosis not present

## 2022-11-25 DIAGNOSIS — R7301 Impaired fasting glucose: Secondary | ICD-10-CM | POA: Diagnosis not present

## 2022-12-01 DIAGNOSIS — I10 Essential (primary) hypertension: Secondary | ICD-10-CM | POA: Diagnosis not present

## 2022-12-01 DIAGNOSIS — N1831 Chronic kidney disease, stage 3a: Secondary | ICD-10-CM | POA: Diagnosis not present

## 2022-12-01 DIAGNOSIS — G894 Chronic pain syndrome: Secondary | ICD-10-CM | POA: Diagnosis not present

## 2022-12-01 DIAGNOSIS — Z0001 Encounter for general adult medical examination with abnormal findings: Secondary | ICD-10-CM | POA: Diagnosis not present

## 2022-12-01 DIAGNOSIS — R7301 Impaired fasting glucose: Secondary | ICD-10-CM | POA: Diagnosis not present

## 2022-12-01 DIAGNOSIS — L309 Dermatitis, unspecified: Secondary | ICD-10-CM | POA: Diagnosis not present

## 2022-12-01 DIAGNOSIS — I129 Hypertensive chronic kidney disease with stage 1 through stage 4 chronic kidney disease, or unspecified chronic kidney disease: Secondary | ICD-10-CM | POA: Diagnosis not present

## 2022-12-01 DIAGNOSIS — F5101 Primary insomnia: Secondary | ICD-10-CM | POA: Diagnosis not present

## 2022-12-01 DIAGNOSIS — R079 Chest pain, unspecified: Secondary | ICD-10-CM | POA: Diagnosis not present

## 2022-12-01 DIAGNOSIS — Z23 Encounter for immunization: Secondary | ICD-10-CM | POA: Diagnosis not present

## 2022-12-01 DIAGNOSIS — E782 Mixed hyperlipidemia: Secondary | ICD-10-CM | POA: Diagnosis not present

## 2023-04-13 DIAGNOSIS — H5702 Anisocoria: Secondary | ICD-10-CM | POA: Diagnosis not present

## 2023-04-13 DIAGNOSIS — H5203 Hypermetropia, bilateral: Secondary | ICD-10-CM | POA: Diagnosis not present

## 2023-04-13 DIAGNOSIS — Z961 Presence of intraocular lens: Secondary | ICD-10-CM | POA: Diagnosis not present

## 2023-04-13 DIAGNOSIS — H52223 Regular astigmatism, bilateral: Secondary | ICD-10-CM | POA: Diagnosis not present

## 2023-04-13 DIAGNOSIS — H1045 Other chronic allergic conjunctivitis: Secondary | ICD-10-CM | POA: Diagnosis not present

## 2023-04-13 DIAGNOSIS — H524 Presbyopia: Secondary | ICD-10-CM | POA: Diagnosis not present

## 2023-06-06 DIAGNOSIS — N1831 Chronic kidney disease, stage 3a: Secondary | ICD-10-CM | POA: Diagnosis not present

## 2023-06-06 DIAGNOSIS — R7301 Impaired fasting glucose: Secondary | ICD-10-CM | POA: Diagnosis not present

## 2023-06-06 DIAGNOSIS — M8580 Other specified disorders of bone density and structure, unspecified site: Secondary | ICD-10-CM | POA: Diagnosis not present

## 2023-06-13 DIAGNOSIS — I10 Essential (primary) hypertension: Secondary | ICD-10-CM | POA: Diagnosis not present

## 2023-06-13 DIAGNOSIS — I129 Hypertensive chronic kidney disease with stage 1 through stage 4 chronic kidney disease, or unspecified chronic kidney disease: Secondary | ICD-10-CM | POA: Diagnosis not present

## 2023-06-13 DIAGNOSIS — G894 Chronic pain syndrome: Secondary | ICD-10-CM | POA: Diagnosis not present

## 2023-06-13 DIAGNOSIS — E782 Mixed hyperlipidemia: Secondary | ICD-10-CM | POA: Diagnosis not present

## 2023-06-13 DIAGNOSIS — R011 Cardiac murmur, unspecified: Secondary | ICD-10-CM | POA: Diagnosis not present

## 2023-06-13 DIAGNOSIS — J4540 Moderate persistent asthma, uncomplicated: Secondary | ICD-10-CM | POA: Diagnosis not present

## 2023-06-13 DIAGNOSIS — M8580 Other specified disorders of bone density and structure, unspecified site: Secondary | ICD-10-CM | POA: Diagnosis not present

## 2023-06-13 DIAGNOSIS — R7303 Prediabetes: Secondary | ICD-10-CM | POA: Diagnosis not present

## 2023-06-13 DIAGNOSIS — F5101 Primary insomnia: Secondary | ICD-10-CM | POA: Diagnosis not present

## 2023-06-13 DIAGNOSIS — F17200 Nicotine dependence, unspecified, uncomplicated: Secondary | ICD-10-CM | POA: Diagnosis not present

## 2023-06-13 DIAGNOSIS — N1831 Chronic kidney disease, stage 3a: Secondary | ICD-10-CM | POA: Diagnosis not present

## 2023-07-05 DIAGNOSIS — Z1211 Encounter for screening for malignant neoplasm of colon: Secondary | ICD-10-CM | POA: Diagnosis not present

## 2023-07-11 DIAGNOSIS — I10 Essential (primary) hypertension: Secondary | ICD-10-CM | POA: Diagnosis not present

## 2023-07-11 DIAGNOSIS — F17210 Nicotine dependence, cigarettes, uncomplicated: Secondary | ICD-10-CM | POA: Diagnosis not present

## 2023-07-11 DIAGNOSIS — F17200 Nicotine dependence, unspecified, uncomplicated: Secondary | ICD-10-CM | POA: Diagnosis not present

## 2023-07-22 LAB — COLOGUARD: COLOGUARD: NEGATIVE

## 2023-08-14 DIAGNOSIS — Z79899 Other long term (current) drug therapy: Secondary | ICD-10-CM | POA: Diagnosis not present

## 2023-08-14 DIAGNOSIS — F17210 Nicotine dependence, cigarettes, uncomplicated: Secondary | ICD-10-CM | POA: Diagnosis not present

## 2023-08-14 DIAGNOSIS — I10 Essential (primary) hypertension: Secondary | ICD-10-CM | POA: Diagnosis not present

## 2023-08-14 DIAGNOSIS — M545 Low back pain, unspecified: Secondary | ICD-10-CM | POA: Diagnosis not present

## 2023-08-14 DIAGNOSIS — F17200 Nicotine dependence, unspecified, uncomplicated: Secondary | ICD-10-CM | POA: Diagnosis not present

## 2023-08-14 DIAGNOSIS — Z23 Encounter for immunization: Secondary | ICD-10-CM | POA: Diagnosis not present

## 2023-12-08 DIAGNOSIS — R7303 Prediabetes: Secondary | ICD-10-CM | POA: Diagnosis not present

## 2023-12-08 DIAGNOSIS — N1831 Chronic kidney disease, stage 3a: Secondary | ICD-10-CM | POA: Diagnosis not present

## 2023-12-08 DIAGNOSIS — M8580 Other specified disorders of bone density and structure, unspecified site: Secondary | ICD-10-CM | POA: Diagnosis not present

## 2023-12-14 DIAGNOSIS — R011 Cardiac murmur, unspecified: Secondary | ICD-10-CM | POA: Diagnosis not present

## 2023-12-14 DIAGNOSIS — M545 Low back pain, unspecified: Secondary | ICD-10-CM | POA: Diagnosis not present

## 2023-12-14 DIAGNOSIS — F5101 Primary insomnia: Secondary | ICD-10-CM | POA: Diagnosis not present

## 2023-12-14 DIAGNOSIS — I129 Hypertensive chronic kidney disease with stage 1 through stage 4 chronic kidney disease, or unspecified chronic kidney disease: Secondary | ICD-10-CM | POA: Diagnosis not present

## 2023-12-14 DIAGNOSIS — N1831 Chronic kidney disease, stage 3a: Secondary | ICD-10-CM | POA: Diagnosis not present

## 2023-12-14 DIAGNOSIS — M5440 Lumbago with sciatica, unspecified side: Secondary | ICD-10-CM | POA: Diagnosis not present

## 2023-12-14 DIAGNOSIS — R7303 Prediabetes: Secondary | ICD-10-CM | POA: Diagnosis not present

## 2023-12-14 DIAGNOSIS — E782 Mixed hyperlipidemia: Secondary | ICD-10-CM | POA: Diagnosis not present

## 2023-12-14 DIAGNOSIS — I10 Essential (primary) hypertension: Secondary | ICD-10-CM | POA: Diagnosis not present

## 2023-12-14 DIAGNOSIS — M8580 Other specified disorders of bone density and structure, unspecified site: Secondary | ICD-10-CM | POA: Diagnosis not present

## 2023-12-14 DIAGNOSIS — G894 Chronic pain syndrome: Secondary | ICD-10-CM | POA: Diagnosis not present

## 2024-01-17 DIAGNOSIS — I10 Essential (primary) hypertension: Secondary | ICD-10-CM | POA: Diagnosis not present

## 2024-06-06 DIAGNOSIS — N1831 Chronic kidney disease, stage 3a: Secondary | ICD-10-CM | POA: Diagnosis not present

## 2024-06-06 DIAGNOSIS — M8580 Other specified disorders of bone density and structure, unspecified site: Secondary | ICD-10-CM | POA: Diagnosis not present

## 2024-06-06 DIAGNOSIS — R7303 Prediabetes: Secondary | ICD-10-CM | POA: Diagnosis not present

## 2024-06-12 DIAGNOSIS — I129 Hypertensive chronic kidney disease with stage 1 through stage 4 chronic kidney disease, or unspecified chronic kidney disease: Secondary | ICD-10-CM | POA: Diagnosis not present

## 2024-06-12 DIAGNOSIS — G894 Chronic pain syndrome: Secondary | ICD-10-CM | POA: Diagnosis not present

## 2024-06-12 DIAGNOSIS — M8580 Other specified disorders of bone density and structure, unspecified site: Secondary | ICD-10-CM | POA: Diagnosis not present

## 2024-06-12 DIAGNOSIS — I10 Essential (primary) hypertension: Secondary | ICD-10-CM | POA: Diagnosis not present

## 2024-06-12 DIAGNOSIS — F5101 Primary insomnia: Secondary | ICD-10-CM | POA: Diagnosis not present

## 2024-06-12 DIAGNOSIS — R7303 Prediabetes: Secondary | ICD-10-CM | POA: Diagnosis not present

## 2024-06-12 DIAGNOSIS — J302 Other seasonal allergic rhinitis: Secondary | ICD-10-CM | POA: Diagnosis not present

## 2024-06-12 DIAGNOSIS — E782 Mixed hyperlipidemia: Secondary | ICD-10-CM | POA: Diagnosis not present

## 2024-06-12 DIAGNOSIS — M545 Low back pain, unspecified: Secondary | ICD-10-CM | POA: Diagnosis not present

## 2024-06-12 DIAGNOSIS — J4540 Moderate persistent asthma, uncomplicated: Secondary | ICD-10-CM | POA: Diagnosis not present

## 2024-06-12 DIAGNOSIS — N1831 Chronic kidney disease, stage 3a: Secondary | ICD-10-CM | POA: Diagnosis not present

## 2024-06-13 ENCOUNTER — Other Ambulatory Visit (HOSPITAL_COMMUNITY): Payer: Self-pay | Admitting: Internal Medicine

## 2024-06-13 DIAGNOSIS — M8580 Other specified disorders of bone density and structure, unspecified site: Secondary | ICD-10-CM

## 2024-06-18 ENCOUNTER — Other Ambulatory Visit (HOSPITAL_COMMUNITY): Payer: Self-pay | Admitting: Internal Medicine

## 2024-06-18 DIAGNOSIS — Z1231 Encounter for screening mammogram for malignant neoplasm of breast: Secondary | ICD-10-CM

## 2024-07-29 ENCOUNTER — Ambulatory Visit (HOSPITAL_COMMUNITY)

## 2024-07-29 ENCOUNTER — Other Ambulatory Visit (HOSPITAL_COMMUNITY)

## 2024-08-07 ENCOUNTER — Ambulatory Visit (HOSPITAL_COMMUNITY)
Admission: RE | Admit: 2024-08-07 | Discharge: 2024-08-07 | Disposition: A | Discharge status: Home / Self Care | Payer: Self-pay | Source: Ambulatory Visit | Attending: Internal Medicine | Admitting: Internal Medicine

## 2024-08-07 ENCOUNTER — Ambulatory Visit (HOSPITAL_COMMUNITY)
Admission: RE | Admit: 2024-08-07 | Discharge: 2024-08-07 | Disposition: A | Discharge status: Home / Self Care | Source: Ambulatory Visit | Attending: Internal Medicine | Admitting: Internal Medicine

## 2024-08-07 ENCOUNTER — Encounter (HOSPITAL_COMMUNITY): Payer: Self-pay

## 2024-08-07 DIAGNOSIS — Z13820 Encounter for screening for osteoporosis: Secondary | ICD-10-CM | POA: Diagnosis not present

## 2024-08-07 DIAGNOSIS — Z780 Asymptomatic menopausal state: Secondary | ICD-10-CM | POA: Diagnosis not present

## 2024-08-07 DIAGNOSIS — Z1231 Encounter for screening mammogram for malignant neoplasm of breast: Secondary | ICD-10-CM | POA: Diagnosis not present

## 2024-08-07 DIAGNOSIS — M8589 Other specified disorders of bone density and structure, multiple sites: Secondary | ICD-10-CM | POA: Diagnosis not present

## 2024-08-07 DIAGNOSIS — M8580 Other specified disorders of bone density and structure, unspecified site: Secondary | ICD-10-CM | POA: Diagnosis present

## 2024-08-07 DIAGNOSIS — M85832 Other specified disorders of bone density and structure, left forearm: Secondary | ICD-10-CM | POA: Diagnosis not present

## 2024-11-05 ENCOUNTER — Emergency Department (HOSPITAL_COMMUNITY): Admission: EM | Admit: 2024-11-05 | Discharge: 2024-11-05 | Disposition: A | Discharge status: Home / Self Care

## 2024-11-05 ENCOUNTER — Encounter (HOSPITAL_COMMUNITY): Payer: Self-pay | Admitting: Emergency Medicine

## 2024-11-05 ENCOUNTER — Emergency Department (HOSPITAL_COMMUNITY)

## 2024-11-05 ENCOUNTER — Other Ambulatory Visit: Payer: Self-pay

## 2024-11-05 DIAGNOSIS — I10 Essential (primary) hypertension: Secondary | ICD-10-CM | POA: Diagnosis not present

## 2024-11-05 DIAGNOSIS — Y92000 Kitchen of unspecified non-institutional (private) residence as  the place of occurrence of the external cause: Secondary | ICD-10-CM | POA: Diagnosis not present

## 2024-11-05 DIAGNOSIS — R220 Localized swelling, mass and lump, head: Secondary | ICD-10-CM | POA: Diagnosis present

## 2024-11-05 DIAGNOSIS — W01198A Fall on same level from slipping, tripping and stumbling with subsequent striking against other object, initial encounter: Secondary | ICD-10-CM | POA: Diagnosis not present

## 2024-11-05 DIAGNOSIS — S0990XA Unspecified injury of head, initial encounter: Secondary | ICD-10-CM | POA: Insufficient documentation

## 2024-11-05 DIAGNOSIS — W19XXXA Unspecified fall, initial encounter: Secondary | ICD-10-CM

## 2024-11-05 LAB — CBC WITH DIFFERENTIAL/PLATELET
Abs Immature Granulocytes: 0.08 K/uL — ABNORMAL HIGH (ref 0.00–0.07)
Basophils Absolute: 0.1 K/uL (ref 0.0–0.1)
Basophils Relative: 1 %
Eosinophils Absolute: 0 K/uL (ref 0.0–0.5)
Eosinophils Relative: 0 %
HCT: 38.1 % (ref 36.0–46.0)
Hemoglobin: 12.2 g/dL (ref 12.0–15.0)
Immature Granulocytes: 1 %
Lymphocytes Relative: 6 %
Lymphs Abs: 0.5 K/uL — ABNORMAL LOW (ref 0.7–4.0)
MCH: 31.1 pg (ref 26.0–34.0)
MCHC: 32 g/dL (ref 30.0–36.0)
MCV: 97.2 fL (ref 80.0–100.0)
Monocytes Absolute: 0.6 K/uL (ref 0.1–1.0)
Monocytes Relative: 7 %
Neutro Abs: 7.9 K/uL — ABNORMAL HIGH (ref 1.7–7.7)
Neutrophils Relative %: 85 %
Platelets: 299 K/uL (ref 150–400)
RBC: 3.92 MIL/uL (ref 3.87–5.11)
RDW: 12.7 % (ref 11.5–15.5)
WBC: 9.2 K/uL (ref 4.0–10.5)
nRBC: 0 % (ref 0.0–0.2)

## 2024-11-05 LAB — URINALYSIS, ROUTINE W REFLEX MICROSCOPIC
Bilirubin Urine: NEGATIVE
Glucose, UA: NEGATIVE mg/dL
Hgb urine dipstick: NEGATIVE
Ketones, ur: NEGATIVE mg/dL
Leukocytes,Ua: NEGATIVE
Nitrite: NEGATIVE
Protein, ur: NEGATIVE mg/dL
Specific Gravity, Urine: 1.016 (ref 1.005–1.030)
pH: 5 (ref 5.0–8.0)

## 2024-11-05 LAB — BASIC METABOLIC PANEL WITH GFR
Anion gap: 14 (ref 5–15)
BUN: 19 mg/dL (ref 8–23)
CO2: 22 mmol/L (ref 22–32)
Calcium: 9.7 mg/dL (ref 8.9–10.3)
Chloride: 103 mmol/L (ref 98–111)
Creatinine, Ser: 0.88 mg/dL (ref 0.44–1.00)
GFR, Estimated: >60 mL/min
Glucose, Bld: 134 mg/dL — ABNORMAL HIGH (ref 70–99)
Potassium: 4.1 mmol/L (ref 3.5–5.1)
Sodium: 139 mmol/L (ref 135–145)

## 2024-11-05 NOTE — Discharge Instructions (Signed)
 Your CTs today are negative for any injury to your head or neck from today's falls.  Refer to the head injury instructions below, plan follow-up with your primary doctor if you have any persistent or lingering symptoms as outlined in these head injury instructions.  You do have a low-grade fever here, which may be a reaction to yesterday's vaccines, I do recommend taken Tylenol or ibuprofen as discussed to help you while your body is recovering from these vaccines.  Plan follow-up care with your primary doctor for any persistent or worsening symptoms.

## 2024-11-05 NOTE — ED Triage Notes (Signed)
 Pt to ed via [facility] c/o of 2 falls within the last hour. Pt endorses a bump to the back to the head. Initial fall was in the kitchen and hit head on fridge. Pt was able to get up and ambulate. Second fall was in the back of the house. Pt denies LOC. Pt is on blood thinners. Pt received a covid/flu vaccine in the left arm yesterday. Hx of HTN

## 2024-11-05 NOTE — ED Notes (Signed)
 ED Provider at bedside.

## 2024-11-05 NOTE — ED Provider Notes (Signed)
 " [place] EMERGENCY DEPARTMENT AT [facility] Provider Note   CSN: [id number] Arrival date & time: 11/05/24  1835     Patient presents with: Tanya Hammond is a 79 y.o. female with history including hypertension, vertigo, history of low back pain presenting for evaluation of 2 falls occurring within the past hour.  She describes standing in the kitchen when her legs became weak, she fell backward and hit the back of her head on the freezer door.  She was able to get up and states that later she was getting her dogs dinner prepared when she had another episode where her legs felt weak and she fell as well.  She does endorse some mild pain in her left lateral neck and also sustained a small bump on the back of her head.  She denies any other complaint of pain or injury.  She also denies LOC with either event.  She does report feeling generalized fatigue since yesterday, she received her COVID and her flu vaccine in her left arm yesterday.  Triage initially thought patient was on an anticoagulant, review of her medications and discussion with patient confirms she is not on a blood thinning medication.   The history is provided by the patient.       Prior to Admission medications  Medication Sig Start Date End Date Taking? Authorizing Provider  alendronate (FOSAMAX) 70 MG tablet Take by mouth. 06/15/22   [provider]  ALPRAZolam (XANAX) 0.25 MG tablet Take 0.25-0.5 mg by mouth at bedtime. 04/25/22   [provider]  amitriptyline (ELAVIL) 25 MG tablet Take 25 mg by mouth at bedtime.      [provider]  cetirizine (ZYRTEC) 10 MG tablet Take 10 mg by mouth daily.    [provider]  chlorhexidine (PERIDEX) 0.12 % solution Use as directed 15 mLs in the mouth or throat at bedtime. 08/28/18   [provider]  escitalopram (LEXAPRO) 10 MG tablet Take 10 mg by mouth daily with breakfast. 08/27/18   [provider]  ibuprofen  (ADVIL,MOTRIN) 200 MG tablet Take 600 mg by mouth every 6 (six) hours as needed for moderate pain or headache.    [provider]  losartan (COZAAR) 50 MG tablet Take 50 mg by mouth at bedtime. 07/01/18   [provider]  Menthol, Topical Analgesic, (ICY HOT EX) Apply 1 application topically daily as needed (sciatic nerve pain).    [provider]  methocarbamol (ROBAXIN) 500 MG tablet Take 500 mg by mouth 3 (three) times daily as needed (sciatic nerve pain).    [provider]  metoprolol tartrate (LOPRESSOR) 25 MG tablet Take 25 mg by mouth 2 (two) times daily as needed (palpitations).    [provider]  montelukast (SINGULAIR) 10 MG tablet Take 10 mg by mouth at bedtime.      [provider]  PROAIR HFA [phone] Base) MCG/ACT inhaler Inhale 1-2 puffs into the lungs every 4 (four) hours as needed for shortness of breath. 06/01/18   [provider]  varenicline (CHANTIX PAK) 0.5 MG X 11 & 1 MG X 42 tablet Take by mouth as directed. 06/01/22   [provider]  vitamin B-12 (CYANOCOBALAMIN) 1000 MCG tablet Take 1,000 mcg by mouth 3 (three) times a week.    [provider]  Vitamin D, Ergocalciferol, (DRISDOL) 1.25 MG (50000 UNIT) CAPS capsule Take 50,000 Units by mouth every Monday.    [provider]    Allergies: Penicillins    Review of Systems  Constitutional:  Negative for fever.  HENT:  Negative for congestion and sore throat.   Eyes: Negative.   Respiratory:  Negative for chest tightness and shortness of breath.   Cardiovascular:  Negative for chest pain.  Gastrointestinal:  Negative for abdominal pain and nausea.  Genitourinary: Negative.   Musculoskeletal:  Positive for neck pain. Negative for arthralgias and joint swelling.  Skin: Negative.  Negative for rash and wound.  Neurological:  Positive for weakness and headaches. Negative for dizziness, light-headedness and numbness.  Psychiatric/Behavioral:  Negative.      Updated Vital Signs BP (!) 163/76 (BP Location: Right Arm)   Pulse 98   Temp (!) 100.5 F (38.1 C) (Oral)   Resp 17   Ht 5' 8 (1.727 m)   Wt 80 kg   SpO2 96%   BMI 26.82 kg/m   Physical Exam Vitals and nursing note reviewed.  Constitutional:      Appearance: She is well-developed.  HENT:     Head: Normocephalic and atraumatic.  Eyes:     Extraocular Movements: Extraocular movements intact.     Conjunctiva/sclera: Conjunctivae normal.     Comments: Right pupil 3mm,  left pupil 4mm  Cardiovascular:     Rate and Rhythm: Normal rate and regular rhythm.     Heart sounds: Normal heart sounds.  Pulmonary:     Effort: Pulmonary effort is normal.     Breath sounds: Normal breath sounds. No wheezing.  Abdominal:     General: Bowel sounds are normal.     Palpations: Abdomen is soft.     Tenderness: There is no abdominal tenderness.  Musculoskeletal:        General: Normal range of motion.     Cervical back: Normal range of motion.  Skin:    General: Skin is warm and dry.  Neurological:     Mental Status: She is alert.     (all labs ordered are listed, but only abnormal results are displayed) Labs Reviewed  CBC WITH DIFFERENTIAL/PLATELET - Abnormal; Notable for the following components:      Result Value   Neutro Abs 7.9 (*)    Lymphs Abs 0.5 (*)    Abs Immature Granulocytes 0.08 (*)    All other components within normal limits  BASIC METABOLIC PANEL WITH GFR - Abnormal; Notable for the following components:   Glucose, Bld 134 (*)    All other components within normal limits  URINALYSIS, ROUTINE W REFLEX MICROSCOPIC    EKG: None  Radiology: CT Cervical Spine Wo Contrast Result Date: 11/05/2024 EXAM: CT CERVICAL SPINE WITHOUT CONTRAST 11/05/2024 07:57:54 PM TECHNIQUE: CT of the cervical spine was performed without the administration of intravenous contrast. Multiplanar reformatted images are provided for review. Automated exposure control, iterative  reconstruction, and/or weight based adjustment of the mA/kV was utilized to reduce the radiation dose to as low as reasonably achievable. COMPARISON: None available. CLINICAL HISTORY: Neck trauma (Age >= 65y). FINDINGS: BONES AND ALIGNMENT: No acute fracture or traumatic malalignment. DEGENERATIVE CHANGES: Early degenerative disc and facet disease. SOFT TISSUES: No prevertebral soft tissue swelling. IMPRESSION: 1. No evidence of acute traumatic injury. Electronically signed by: Franky Crease MD 11/05/2024 08:15 PM EST RP Workstation: [id number]   CT Head Wo Contrast Result Date: 11/05/2024 EXAM: CT HEAD WITHOUT CONTRAST 11/05/2024 07:57:54 PM TECHNIQUE: CT of the head was performed without the administration of intravenous contrast. Automated exposure control, iterative reconstruction, and/or weight  based adjustment of the mA/kV was utilized to reduce the radiation dose to as low as reasonably achievable. COMPARISON: 08/30/2018 CLINICAL HISTORY: Head trauma, minor (Age >= 65y) FINDINGS: BRAIN AND VENTRICLES: No acute hemorrhage. No evidence of acute infarct. No hydrocephalus. No extra-axial collection. No mass effect or midline shift. Old bilateral basal ganglia lacunar infarcts. Atrophy and chronic small vessel disease throughout the deep white matter. ORBITS: No acute abnormality. SINUSES: Opacified left paranasal sinuses. No air-fluid levels. SOFT TISSUES AND SKULL: No acute soft tissue abnormality. No skull fracture. IMPRESSION: 1. No acute intracranial abnormality. 2. Atrophy, chronic small vessel disease. 3. Old bilateral basal ganglia lacunar infarcts. Electronically signed by: Franky Crease MD 11/05/2024 08:13 PM EST RP Workstation: [id number]     Procedures   Medications Ordered in the ED - No data to display                                  Medical Decision Making Patient presenting for evaluation of injury sustained in fall in her home, reporting episodes of feeling weak in her legs which have  resolved.  She has been ambulatory in the department without continued weakness and felt much improved by time of discharge.  She did have a low-grade fever here of 100-100.5, she just received her COVID and her influenza vaccine yesterday, she does not have symptoms suggesting influenza, denies shortness of breath, body aches, suspect this may be a reaction to these injections.  She was encouraged close follow-up with her PCP for any persistent or worsening symptoms.  Imaging is negative for acute intracranial or C-spine injuries.  Amount and/or Complexity of Data Reviewed Labs: ordered.    Details: Be met, CBC and urinalysis negative. Radiology: ordered.    Details: CT head and C-spine negative for acute injury.        Final diagnoses:  Fall, initial encounter  Minor head injury, initial encounter    ED Discharge Orders     None          Birdena Clarity, DEVONNA 11/05/24 2316  "

## 2024-11-05 NOTE — ED Notes (Signed)
 This RN noticed unequal pupils that were reactive to light. Left pupil larger than right. Immediately got EDP. EDPA at bedside
